# Patient Record
Sex: Female | Born: 1955
Health system: Southern US, Community
[De-identification: ages and names within clinical notes are randomized; demographics above are authoritative.]

## PROBLEM LIST (undated history)

## (undated) DIAGNOSIS — K219 Gastro-esophageal reflux disease without esophagitis: Secondary | ICD-10-CM

## (undated) DIAGNOSIS — C801 Malignant (primary) neoplasm, unspecified: Secondary | ICD-10-CM

## (undated) DIAGNOSIS — E7800 Pure hypercholesterolemia, unspecified: Secondary | ICD-10-CM

## (undated) HISTORY — PX: BREAST SURGERY: SHX581

## (undated) HISTORY — PX: MASTECTOMY: SHX3

---

## 2020-06-12 ENCOUNTER — Other Ambulatory Visit: Payer: Self-pay

## 2020-06-12 ENCOUNTER — Ambulatory Visit
Admission: RE | Admit: 2020-06-12 | Discharge: 2020-06-12 | Disposition: A | Discharge status: Home / Self Care | Payer: Medicare Other | Source: Ambulatory Visit | Attending: Emergency Medicine | Admitting: Emergency Medicine

## 2020-06-12 VITALS — BP 119/74 | HR 63 | Temp 98.1°F | Resp 19 | Ht 62.0 in | Wt 166.0 lb

## 2020-06-12 DIAGNOSIS — Z23 Encounter for immunization: Secondary | ICD-10-CM | POA: Diagnosis not present

## 2020-06-12 DIAGNOSIS — S6440XA Injury of digital nerve of unspecified finger, initial encounter: Secondary | ICD-10-CM | POA: Diagnosis not present

## 2020-06-12 HISTORY — DX: Pure hypercholesterolemia, unspecified: E78.00

## 2020-06-12 HISTORY — DX: Malignant (primary) neoplasm, unspecified: C80.1

## 2020-06-12 HISTORY — DX: Gastro-esophageal reflux disease without esophagitis: K21.9

## 2020-06-12 MED ORDER — TETANUS-DIPHTH-ACELL PERTUSSIS 5-2.5-18.5 LF-MCG/0.5 IM SUSY
0.5000 mL | PREFILLED_SYRINGE | Freq: Once | INTRAMUSCULAR | Status: AC
  Administered 2020-06-12: 0.5 mL via INTRAMUSCULAR

## 2020-06-12 MED ORDER — CEPHALEXIN 500 MG PO CAPS: 500.0000 mg | ORAL_CAPSULE | Freq: Four times a day (QID) | ORAL | 0 refills | Status: DC

## 2020-06-12 NOTE — ED Triage Notes (Signed)
Laceration to RT knuckle for a steak knife done today, no active bleeding. Unsure of last tetanus vaccine.

## 2020-06-12 NOTE — Discharge Instructions (Addendum)
Bandage applied Keep covered for next and dry for next 24-48 hours.  After then you may gently clean with warm water and mild soap.  Avoid submerging wound in water. Change dressing daily and apply a thin layer of neosporin.  Return in 7-10 days to have sutures removed.   Take OTC ibuprofen or tylenol as needed for pain releif Return sooner or go to the ED if you have any new or worsening symptoms such as increased pain, redness, swelling, drainage, discharge, decreased range of motion of extremity, etc..

## 2020-06-12 NOTE — ED Provider Notes (Signed)
Emma Roberts   [id number] 06/12/20 Arrival Time: 0240  CC: LACERATION  SUBJECTIVE:  Emma Roberts is a 64 y.o. female who presented to the urgent care for complaint of laceration to right right index finger that occurred today.  Patient reports she carries self with a knife.  Currently not on blood thinners.  Denies similar symptoms in the past.  Denies chills, fever, nausea, vomiting, redness, swelling, purulent drainage, decreased strength or sensation.  Td UTD: No.  ROS: As per HPI.  All other pertinent ROS negative.     Past Medical History:  Diagnosis Date  . Cancer [facility])    breast cancer in the past  . GERD (gastroesophageal reflux disease)   . High cholesterol    Past Surgical History:  Procedure Laterality Date  . BREAST SURGERY     No Known Allergies No current facility-administered medications on file prior to encounter.   No current outpatient medications on file prior to encounter.   Social History   Socioeconomic History  . Marital status: Single    Spouse name: Not on file  . Number of children: Not on file  . Years of education: Not on file  . Highest education level: Not on file  Occupational History  . Not on file  Tobacco Use  . Smoking status: Never Smoker  . Smokeless tobacco: Never Used  Substance and Sexual Activity  . Alcohol use: Never  . Drug use: Never  . Sexual activity: Not on file  Other Topics Concern  . Not on file  Social History Narrative  . Not on file   Social Determinants of Health   Financial Resource Strain:   . Difficulty of Paying Living Expenses: Not on file  Food Insecurity:   . Worried About Charity fundraiser in the Last Year: Not on file  . Ran Out of Food in the Last Year: Not on file  Transportation Needs:   . Lack of Transportation (Medical): Not on file  . Lack of Transportation (Non-Medical): Not on file  Physical Activity:   . Days of Exercise per Week: Not on file  . Minutes of Exercise per  Session: Not on file  Stress:   . Feeling of Stress : Not on file  Social Connections:   . Frequency of Communication with Friends and Family: Not on file  . Frequency of Social Gatherings with Friends and Family: Not on file  . Attends Religious Services: Not on file  . Active Member of Clubs or Organizations: Not on file  . Attends Archivist Meetings: Not on file  . Marital Status: Not on file  Intimate Partner Violence:   . Fear of Current or Ex-Partner: Not on file  . Emotionally Abused: Not on file  . Physically Abused: Not on file  . Sexually Abused: Not on file   History reviewed. No pertinent family history.   OBJECTIVE:  Vitals:   06/12/20 1514 06/12/20 1516  BP:  119/74  Pulse:  63  Resp:  19  Temp:  98.1 F (36.7 C)  TempSrc:  Oral  SpO2:  95%  Weight: 166 lb (75.3 kg)   Height: 5\' 2"  (1.575 m)      General appearance: alert; no distress Chest: CTA, heart sounds normal Heart: RRR, no rub, gallop or murmur Skin: laceration of right index finger size: approx 2.5 cm Psychological: alert and cooperative; normal mood and affect   No results found for this or any previous visit.  Labs  Reviewed - No data to display  No results found.  Procedure: Verbal consent obtained. Patient provided with risks and alternatives to the procedure. Wound copiously irrigated with NS then cleansed with betadine. Anesthetized with 2 mL of lidocaine without epinephrine after LET. Wound carefully explored. No foreign body, tendon injury, or nonviable tissue were noted. Using sterile technique 2 interrupted 5-0 Ethilon Prolene sutures were placed to reapproximate the wound. Patient tolerated procedure well. No complications. Minimal bleeding. Patient advised to look for and return for any signs of infection such as redness, swelling, discharge, or worsening pain. Return for suture removal in 7 days.  ASSESSMENT & PLAN:  1. Laceration of digital nerve of finger, initial  encounter     Meds ordered this encounter  Medications  . Tdap (BOOSTRIX) injection 0.5 mL  . cephALEXin (KEFLEX) 500 MG capsule    Sig: Take 1 capsule (500 mg total) by mouth 4 (four) times daily.    Dispense:  20 capsule    Refill:  0    Discharge instructions  Bandage applied Keep covered for next and dry for next 24-48 hours.  After then you may gently clean with warm water and mild soap.  Avoid submerging wound in water. Change dressing daily and apply a thin layer of neosporin.  Return in 7-10 days to have sutures removed.   Take OTC ibuprofen or tylenol as needed for pain releif Return sooner or go to the ED if you have any new or worsening symptoms such as increased pain, redness, swelling, drainage, discharge, decreased range of motion of extremity, etc..     Reviewed expectations re: course of current medical issues. Questions answered. Outlined signs and symptoms indicating need for more acute intervention. Patient verbalized understanding. After Visit Summary given.   Emma Roberts, [place] 06/12/20 1654

## 2020-07-29 ENCOUNTER — Other Ambulatory Visit: Payer: Self-pay

## 2020-07-29 ENCOUNTER — Encounter: Payer: Self-pay | Admitting: Emergency Medicine

## 2020-07-29 ENCOUNTER — Ambulatory Visit
Admission: EM | Admit: 2020-07-29 | Discharge: 2020-07-29 | Disposition: A | Discharge status: Home / Self Care | Payer: Medicare (Managed Care) | Attending: Emergency Medicine | Admitting: Emergency Medicine

## 2020-07-29 DIAGNOSIS — R300 Dysuria: Secondary | ICD-10-CM | POA: Insufficient documentation

## 2020-07-29 LAB — POCT URINALYSIS DIP (MANUAL ENTRY)
Bilirubin, UA: NEGATIVE
Blood, UA: NEGATIVE
Glucose, UA: NEGATIVE mg/dL
Leukocytes, UA: NEGATIVE
Nitrite, UA: NEGATIVE
Protein Ur, POC: 30 mg/dL — AB
Spec Grav, UA: >=1.03 — AB (ref 1.010–1.025)
Urobilinogen, UA: 0.2 E.U./dL
pH, UA: 6 (ref 5.0–8.0)

## 2020-07-29 NOTE — ED Triage Notes (Signed)
Burning and urinary frequency that started Sunday

## 2020-07-29 NOTE — Discharge Instructions (Addendum)
Urine culture sent.  We will call you with the results.   Push fluids and get plenty of rest.   Continue to use Azo and cranberry juice as directed Follow up with PCP if symptoms persists Return here or go to ER if you have any new or worsening symptoms such as fever, worsening abdominal pain, nausea/vomiting, flank pain, etc..Marland Kitchen

## 2020-07-29 NOTE — ED Provider Notes (Signed)
[facility]   Chief Complaint  Patient presents with  . Urinary Tract Infection     SUBJECTIVE:  Emma Roberts is a 65 y.o. female who presented to the urgent care for complaint of dysuria and urinary frequency that started this past Sunday.  Patient denies a precipitating event, recent sexual encounter, excessive caffeine intake.  Denies abdomen/flank pain.  Has tried Azo and cranberry juice with mild relief.  Symptoms are made worse with urination.  Admits to similar symptoms in the past.  Denies fever, chills, nausea, vomiting, abdominal pain, flank pain, abnormal vaginal discharge or bleeding, hematuria.    LMP: No LMP recorded. Patient is postmenopausal.  ROS: As in HPI.  All other pertinent ROS negative.     Past Medical History:  Diagnosis Date  . Cancer [facility])    breast cancer in the past  . GERD (gastroesophageal reflux disease)   . High cholesterol    Past Surgical History:  Procedure Laterality Date  . BREAST SURGERY     No Known Allergies No current facility-administered medications on file prior to encounter.   Current Outpatient Medications on File Prior to Encounter  Medication Sig Dispense Refill  . cephALEXin (KEFLEX) 500 MG capsule Take 1 capsule (500 mg total) by mouth 4 (four) times daily. 20 capsule 0   Social History   Socioeconomic History  . Marital status: Single    Spouse name: Not on file  . Number of children: Not on file  . Years of education: Not on file  . Highest education level: Not on file  Occupational History  . Not on file  Tobacco Use  . Smoking status: Never Smoker  . Smokeless tobacco: Never Used  Substance and Sexual Activity  . Alcohol use: Never  . Drug use: Never  . Sexual activity: Not on file  Other Topics Concern  . Not on file  Social History Narrative  . Not on file   Social Determinants of Health   Financial Resource Strain: Not on file  Food Insecurity: Not on file  Transportation Needs: Not on  file  Physical Activity: Not on file  Stress: Not on file  Social Connections: Not on file  Intimate Partner Violence: Not on file   No family history on file.  OBJECTIVE:  Vitals:   07/29/20 1154  BP: 127/83  Pulse: 77  Resp: 17  Temp: 98 F (36.7 C)  TempSrc: Oral  SpO2: 96%   General appearance: AOx3 in no acute distress HEENT: NCAT.  Oropharynx clear.  Lungs: clear to auscultation bilaterally without adventitious breath sounds Heart: regular rate and rhythm.  Radial pulses 2+ symmetrical bilaterally Abdomen: soft; non-distended; no tenderness; bowel sounds present; no guarding or rebound tenderness Back: no CVA tenderness Extremities: no edema; symmetrical with no gross deformities Skin: warm and dry Neurologic: Ambulates from chair to exam table without difficulty Psychological: alert and cooperative; normal mood and affect  Labs Reviewed  POCT URINALYSIS DIP (MANUAL ENTRY) - Abnormal; Notable for the following components:      Result Value   Color, UA orange (*)    Ketones, POC UA trace (5) (*)    Spec Grav, UA >=1.030 (*)    Protein Ur, POC =30 (*)    All other components within normal limits  URINE CULTURE    ASSESSMENT & PLAN:  1. Dysuria     No orders of the defined types were placed in this encounter.  Discharge Instructions  Urine culture sent.  We  will call you with the results.   Push fluids and get plenty of rest.   Continue to use Azo and cranberry juice as directed Follow up with PCP if symptoms persists Return here or go to ER if you have any new or worsening symptoms such as fever, worsening abdominal pain, nausea/vomiting, flank pain, etc...  Outlined signs and symptoms indicating need for more acute intervention. Patient verbalized understanding. After Visit Summary given.     Durward Parcel, FNP 07/29/20 1230

## 2020-07-31 LAB — URINE CULTURE: Culture: <10000 — AB

## 2020-09-27 ENCOUNTER — Ambulatory Visit: Payer: Medicare (Managed Care) | Admitting: Nurse Practitioner

## 2020-10-04 ENCOUNTER — Ambulatory Visit (INDEPENDENT_AMBULATORY_CARE_PROVIDER_SITE_OTHER): Payer: Medicare HMO | Admitting: Nurse Practitioner

## 2020-10-04 ENCOUNTER — Other Ambulatory Visit: Payer: Self-pay

## 2020-10-04 ENCOUNTER — Encounter: Payer: Self-pay | Admitting: Nurse Practitioner

## 2020-10-04 VITALS — BP 129/70 | HR 80 | Temp 99.2°F | Resp 20 | Ht 62.0 in | Wt 170.0 lb

## 2020-10-04 DIAGNOSIS — Z7689 Persons encountering health services in other specified circumstances: Secondary | ICD-10-CM | POA: Diagnosis not present

## 2020-10-04 DIAGNOSIS — R079 Chest pain, unspecified: Secondary | ICD-10-CM | POA: Diagnosis not present

## 2020-10-04 DIAGNOSIS — E785 Hyperlipidemia, unspecified: Secondary | ICD-10-CM

## 2020-10-04 DIAGNOSIS — Z0001 Encounter for general adult medical examination with abnormal findings: Secondary | ICD-10-CM | POA: Insufficient documentation

## 2020-10-04 DIAGNOSIS — K219 Gastro-esophageal reflux disease without esophagitis: Secondary | ICD-10-CM

## 2020-10-04 DIAGNOSIS — F339 Major depressive disorder, recurrent, unspecified: Secondary | ICD-10-CM

## 2020-10-04 DIAGNOSIS — R413 Other amnesia: Secondary | ICD-10-CM

## 2020-10-04 DIAGNOSIS — G4700 Insomnia, unspecified: Secondary | ICD-10-CM

## 2020-10-04 DIAGNOSIS — Z139 Encounter for screening, unspecified: Secondary | ICD-10-CM | POA: Diagnosis not present

## 2020-10-04 MED ORDER — FLUOXETINE HCL 20 MG PO TABS: 20.0000 mg | ORAL_TABLET | Freq: Every day | ORAL | 1 refills | Status: DC

## 2020-10-04 NOTE — Assessment & Plan Note (Addendum)
-  had PCP in [place], [place] -obtain records

## 2020-10-04 NOTE — Addendum Note (Signed)
Addended by: Demetrius Revel on: 10/04/2020 03:37 PM   Modules accepted: Orders

## 2020-10-04 NOTE — Patient Instructions (Addendum)
We will meet back up in 2 weeks to discuss labs. Please have fasting lab work drawn 2-3 days prior to you visit so we can review them at the time of your appointment.

## 2020-10-04 NOTE — Progress Notes (Addendum)
New Patient Office Visit  Subjective:  Patient ID: Emma Roberts, female    DOB: 1955/11/01  Age: 65 y.o. MRN: [id number]  CC:  Chief Complaint  Patient presents with  . New Patient (Initial Visit)    Memory issues     HPI Emma Roberts presents for new patient visit. Transferring care from [place], [place] and she went to [facility]. Last physical Sept 2021. Last labs were done then.  She has some short-term memory loss and some long-term memory loss. She states that she can't remember what she ate in a day, and she has got lost in car and couldn't find her way home.   She has been taking supplements to help her memory.  Past Medical History:  Diagnosis Date  . Cancer [facility])    breast cancer in the past  . GERD (gastroesophageal reflux disease)   . High cholesterol     Past Surgical History:  Procedure Laterality Date  . BREAST SURGERY      History reviewed. No pertinent family history.  Social History   Socioeconomic History  . Marital status: Single    Spouse name: Not on file  . Number of children: Not on file  . Years of education: Not on file  . Highest education level: Not on file  Occupational History  . Not on file  Tobacco Use  . Smoking status: Never Smoker  . Smokeless tobacco: Never Used  Vaping Use  . Vaping Use: Never used  Substance and Sexual Activity  . Alcohol use: Never  . Drug use: Never  . Sexual activity: Not on file  Other Topics Concern  . Not on file  Social History Narrative  . Not on file   Social Determinants of Health   Financial Resource Strain: Not on file  Food Insecurity: Not on file  Transportation Needs: Not on file  Physical Activity: Not on file  Stress: Not on file  Social Connections: Not on file  Intimate Partner Violence: Not on file    ROS Review of Systems  Constitutional: Negative.   Respiratory: Negative.   Cardiovascular: Positive for chest pain. Negative for palpitations and leg  swelling.       Sharp pain in her chest several weeks ago  Neurological: Negative for dizziness, tremors, seizures, syncope, facial asymmetry, speech difficulty, weakness and headaches.       Memory impairment  Psychiatric/Behavioral: Negative for self-injury and suicidal ideas.       Memory impairment for last few months    Objective:   Today's Vitals: BP 129/70   Pulse 80   Temp 99.2 F (37.3 C)   Resp 20   Ht 5' 2"  (1.575 m)   Wt 170 lb (77.1 kg)   SpO2 97%   BMI 31.09 kg/m   Physical Exam Constitutional:      Appearance: Normal appearance.  Cardiovascular:     Rate and Rhythm: Normal rate and regular rhythm.     Pulses: Normal pulses.     Heart sounds: Normal heart sounds.  Pulmonary:     Effort: Pulmonary effort is normal.     Breath sounds: Normal breath sounds.  Neurological:     General: No focal deficit present.     Mental Status: She is alert and oriented to person, place, and time.     Cranial Nerves: No cranial nerve deficit.     Sensory: No sensory deficit.     Motor: No weakness.  Coordination: Coordination normal.     Gait: Gait normal.     Comments: MMSE=25/30     Assessment & Plan:   Problem List Items Addressed This Visit      Digestive   GERD (gastroesophageal reflux disease)   Relevant Medications   esomeprazole (NEXIUM) 40 MG capsule     Other   Encounter to establish care    -had PCP in [place], [place] -obtain records      Relevant Orders   CBC with Differential/Platelet   CMP14+EGFR   HCV Ab w/Rflx to Verification   Lipid Panel With LDL/HDL Ratio   TSH + free T4   VITAMIN D 25 Hydroxy (Vit-D Deficiency, Fractures)   B12   Screening due   Relevant Orders   HCV Ab w/Rflx to Verification   Insomnia    -takes trazodone 50 mg PO qhs PRN  -no issues today      Hyperlipidemia    -will check lipids with next set of labs      Relevant Medications   atorvastatin (LIPITOR) 40 MG tablet   Other Relevant Orders   CBC with  Differential/Platelet   CMP14+EGFR   HCV Ab w/Rflx to Verification   Memory loss    -getting worse -moved in with daughter d/t memory issues -takes Lion's Mane supplement to help with memory -MMSE today is 25/30 -she would like referral to neurology for dementia eval -labs ordered today; will rule out Vit D deficiency      Relevant Orders   CBC with Differential/Platelet   CMP14+EGFR   Lipid Panel With LDL/HDL Ratio   TSH + free T4   VITAMIN D 25 Hydroxy (Vit-D Deficiency, Fractures)   B12   Ambulatory referral to Neurology   Depression, recurrent ([place])    -she is requesting that I stop lexapro and start fluoxetine -she states she started lexapro in September and she wants to see if that helps her memory issues STOP lexapro -Rx. Fluoxetine; may need to increase dose in the future       Relevant Medications   traZODone (DESYREL) 50 MG tablet   FLUoxetine (PROZAC) 20 MG tablet    Other Visit Diagnoses    Intermittent chest pain    -  Primary   Relevant Medications   traZODone (DESYREL) 50 MG tablet   FLUoxetine (PROZAC) 20 MG tablet   Other Relevant Orders   Ambulatory referral to Cardiology      Outpatient Encounter Medications as of 10/04/2020  Medication Sig  . atorvastatin (LIPITOR) 40 MG tablet Take 40 mg by mouth daily.  Marland Kitchen esomeprazole (NEXIUM) 40 MG capsule Take 40 mg by mouth daily at 12 noon.  Marland Kitchen FLUoxetine (PROZAC) 20 MG tablet Take 1 tablet (20 mg total) by mouth daily.  . traZODone (DESYREL) 50 MG tablet Take 50 mg by mouth at bedtime.  . [DISCONTINUED] escitalopram (LEXAPRO) 10 MG tablet Take 10 mg by mouth daily.  . [DISCONTINUED] cephALEXin (KEFLEX) 500 MG capsule Take 1 capsule (500 mg total) by mouth 4 (four) times daily. (Patient not taking: Reported on 10/04/2020)   No facility-administered encounter medications on file as of 10/04/2020.    Follow-up: Return in about 2 weeks (around 10/18/2020) for Lab follow-up and review records.   Emma Larsson,  NP

## 2020-10-04 NOTE — Assessment & Plan Note (Signed)
-  she is requesting that I stop lexapro and start fluoxetine -she states she started lexapro in September and she wants to see if that helps her memory issues STOP lexapro -Rx. Fluoxetine; may need to increase dose in the future

## 2020-10-04 NOTE — Addendum Note (Signed)
Addended by: Demetrius Revel on: 10/04/2020 03:46 PM   Modules accepted: Orders

## 2020-10-04 NOTE — Assessment & Plan Note (Signed)
-  takes trazodone 50 mg PO qhs PRN  -no issues today

## 2020-10-04 NOTE — Assessment & Plan Note (Signed)
-  will check lipids with next set of labs 

## 2020-10-04 NOTE — Assessment & Plan Note (Addendum)
-  getting worse -moved in with daughter d/t memory issues -takes Lion's Mane supplement to help with memory -MMSE today is 25/30 -she would like referral to neurology for dementia eval -labs ordered today; will rule out Vit D deficiency

## 2020-10-14 ENCOUNTER — Other Ambulatory Visit: Payer: Self-pay | Admitting: Nurse Practitioner

## 2020-10-14 MED ORDER — FLUOXETINE HCL 20 MG PO CAPS: 20.0000 mg | ORAL_CAPSULE | Freq: Every day | ORAL | 3 refills | Status: DC

## 2020-10-14 NOTE — Progress Notes (Signed)
Labs look great.

## 2020-10-15 LAB — CMP14+EGFR
ALT: 19 IU/L (ref 0–32)
AST: 26 IU/L (ref 0–40)
Albumin/Globulin Ratio: 1.8 (ref 1.2–2.2)
Albumin: 4.3 g/dL (ref 3.8–4.8)
Alkaline Phosphatase: 105 IU/L (ref 44–121)
BUN/Creatinine Ratio: 18 (ref 12–28)
BUN: 16 mg/dL (ref 8–27)
Bilirubin Total: 0.6 mg/dL (ref 0.0–1.2)
CO2: 23 mmol/L (ref 20–29)
Calcium: 9.8 mg/dL (ref 8.7–10.3)
Chloride: 100 mmol/L (ref 96–106)
Creatinine, Ser: 0.9 mg/dL (ref 0.57–1.00)
Globulin, Total: 2.4 g/dL (ref 1.5–4.5)
Glucose: 95 mg/dL (ref 65–99)
Potassium: 4.5 mmol/L (ref 3.5–5.2)
Sodium: 145 mmol/L — ABNORMAL HIGH (ref 134–144)
Total Protein: 6.7 g/dL (ref 6.0–8.5)
eGFR: 71 mL/min/{1.73_m2} (ref >59)

## 2020-10-15 LAB — LIPID PANEL WITH LDL/HDL RATIO
Cholesterol, Total: 183 mg/dL (ref 100–199)
HDL: 72 mg/dL (ref >39)
LDL Chol Calc (NIH): 95 mg/dL (ref 0–99)
LDL/HDL Ratio: 1.3 ratio (ref 0.0–3.2)
Triglycerides: 89 mg/dL (ref 0–149)
VLDL Cholesterol Cal: 16 mg/dL (ref 5–40)

## 2020-10-15 LAB — CBC WITH DIFFERENTIAL/PLATELET
Basophils Absolute: 0 10*3/uL (ref 0.0–0.2)
Basos: 1 %
EOS (ABSOLUTE): 0.1 10*3/uL (ref 0.0–0.4)
Eos: 2 %
Hematocrit: 41.2 % (ref 34.0–46.6)
Hemoglobin: 14 g/dL (ref 11.1–15.9)
Immature Grans (Abs): 0 10*3/uL (ref 0.0–0.1)
Immature Granulocytes: 0 %
Lymphocytes Absolute: 1.6 10*3/uL (ref 0.7–3.1)
Lymphs: 29 %
MCH: 31.3 pg (ref 26.6–33.0)
MCHC: 34 g/dL (ref 31.5–35.7)
MCV: 92 fL (ref 79–97)
Monocytes Absolute: 0.5 10*3/uL (ref 0.1–0.9)
Monocytes: 9 %
Neutrophils Absolute: 3.2 10*3/uL (ref 1.4–7.0)
Neutrophils: 59 %
Platelets: 237 10*3/uL (ref 150–450)
RBC: 4.47 x10E6/uL (ref 3.77–5.28)
RDW: 13.5 % (ref 11.7–15.4)
WBC: 5.4 10*3/uL (ref 3.4–10.8)

## 2020-10-15 LAB — VITAMIN D 25 HYDROXY (VIT D DEFICIENCY, FRACTURES): Vit D, 25-Hydroxy: 37.5 ng/mL (ref 30.0–100.0)

## 2020-10-15 LAB — HCV INTERPRETATION

## 2020-10-15 LAB — VITAMIN B12: Vitamin B-12: 577 pg/mL (ref 232–1245)

## 2020-10-15 LAB — TSH+FREE T4
Free T4: 1.08 ng/dL (ref 0.82–1.77)
TSH: 2.17 u[IU]/mL (ref 0.450–4.500)

## 2020-10-15 LAB — HCV AB W/RFLX TO VERIFICATION: HCV Ab: <0.1 s/co ratio (ref 0.0–0.9)

## 2020-10-20 ENCOUNTER — Ambulatory Visit (INDEPENDENT_AMBULATORY_CARE_PROVIDER_SITE_OTHER): Payer: Medicare HMO | Admitting: Nurse Practitioner

## 2020-10-20 ENCOUNTER — Encounter: Payer: Self-pay | Admitting: Nurse Practitioner

## 2020-10-20 ENCOUNTER — Other Ambulatory Visit: Payer: Self-pay

## 2020-10-20 DIAGNOSIS — F322 Major depressive disorder, single episode, severe without psychotic features: Secondary | ICD-10-CM | POA: Diagnosis not present

## 2020-10-20 DIAGNOSIS — E785 Hyperlipidemia, unspecified: Secondary | ICD-10-CM | POA: Diagnosis not present

## 2020-10-20 DIAGNOSIS — R413 Other amnesia: Secondary | ICD-10-CM | POA: Diagnosis not present

## 2020-10-20 NOTE — Assessment & Plan Note (Addendum)
-  Suicide attempt and was hospitalized at [facility] in October 2021 -PHQ-9 = 0 today, so this has improved drastically since moving to [place] from [facility] -reviewed medical records from [facility]

## 2020-10-20 NOTE — Patient Instructions (Signed)
Please have fasting labs drawn 2-3 days prior to your appointment so we can discuss the results during your office visit.  

## 2020-10-20 NOTE — Assessment & Plan Note (Signed)
Lab Results  Component Value Date   CHOL 183 10/13/2020   HDL 72 10/13/2020   LDLCALC 95 10/13/2020   TRIG 89 10/13/2020   -no need to add or change medicines today

## 2020-10-20 NOTE — Assessment & Plan Note (Signed)
-  her last MMSE was 25/30, and she has neurology appointment upcoming -has hospitalization for suicide attempt in Oct 2021 at [facility] and was diagnosed with severe depression, but she states that was related to a roommate issue in [facility] and that resolved when she came to [facility] -PHQ-9 was 0 today, and she does not have depressed mood -will defer meds to neurology

## 2020-10-20 NOTE — Progress Notes (Signed)
Established Patient Office Visit  Subjective:  Patient ID: Emma Roberts, female    DOB: [birth year]  Age: 65 y.o. MRN: [id number]  CC:  Chief Complaint  Patient presents with  . Follow-up    Go over labs; chest pains have resolved.     HPI Emma Roberts presents for lab follow-up. She had new patient visit on 10/04/20 and had complaints about memory loss. Her medical records show that she had a suicide attempt and was hospitalized at [facility] from 04/26/20-05/13/20. She was referred to psychiatry for severe depression, but it is unclear if she followed up, but she definitely doesn't see psychiatry here as she recently moved here.  She states that she has been forgeting items that she needs to purchase at the store. She has not forgotten where she is going when driving recently. She states that in [place] she bought a house with a friend who then wanted to back out of the living situation, and that had er stressed and depressed.  Today, PHQ-9 = 0, and she states that she has been much better with respect to depression since coming to [facility].  Past Medical History:  Diagnosis Date  . Cancer [facility])    breast cancer in the past  . GERD (gastroesophageal reflux disease)   . High cholesterol     Past Surgical History:  Procedure Laterality Date  . BREAST SURGERY      History reviewed. No pertinent family history.  Social History   Socioeconomic History  . Marital status: Single    Spouse name: Not on file  . Number of children: Not on file  . Years of education: Not on file  . Highest education level: Not on file  Occupational History  . Not on file  Tobacco Use  . Smoking status: Never Smoker  . Smokeless tobacco: Never Used  Vaping Use  . Vaping Use: Never used  Substance and Sexual Activity  . Alcohol use: Never  . Drug use: Never  . Sexual activity: Not on file  Other Topics Concern  . Not on file  Social History Narrative  . Not on file   Social Determinants of  Health   Financial Resource Strain: Not on file  Food Insecurity: Not on file  Transportation Needs: Not on file  Physical Activity: Not on file  Stress: Not on file  Social Connections: Not on file  Intimate Partner Violence: Not on file    Outpatient Medications Prior to Visit  Medication Sig Dispense Refill  . atorvastatin (LIPITOR) 40 MG tablet Take 40 mg by mouth daily.    Marland Kitchen esomeprazole (NEXIUM) 40 MG capsule Take 40 mg by mouth daily at 12 noon.    Marland Kitchen FLUoxetine (PROZAC) 20 MG capsule Take 1 capsule (20 mg total) by mouth daily. 30 capsule 3  . traZODone (DESYREL) 50 MG tablet Take 50 mg by mouth at bedtime.     No facility-administered medications prior to visit.    No Known Allergies  ROS Review of Systems  Constitutional: Negative.   Respiratory: Negative.   Cardiovascular: Negative.   Neurological:       Concerned with memory loss  Psychiatric/Behavioral: Negative for dysphoric mood, self-injury and suicidal ideas. The patient is not nervous/anxious.        States depression is much better after getting out of her roommate situation in [place]      Objective:    Physical Exam Constitutional:      Appearance: Normal appearance.  Cardiovascular:  Rate and Rhythm: Normal rate and regular rhythm.     Pulses: Normal pulses.     Heart sounds: Normal heart sounds.  Pulmonary:     Effort: Pulmonary effort is normal.     Breath sounds: Normal breath sounds.  Neurological:     General: No focal deficit present.     Mental Status: She is alert and oriented to person, place, and time.     Cranial Nerves: No cranial nerve deficit.     Motor: No weakness.     Coordination: Coordination normal.  Psychiatric:        Mood and Affect: Mood normal.        Behavior: Behavior normal.        Thought Content: Thought content normal.        Judgment: Judgment normal.     BP 112/62   Pulse 73   Temp 97.6 F (36.4 C)   Resp 18   Ht 5' 2"  (1.575 m)   Wt 168 lb (76.2  kg)   SpO2 96%   BMI 30.73 kg/m  Wt Readings from Last 3 Encounters:  10/20/20 168 lb (76.2 kg)  10/04/20 170 lb (77.1 kg)  06/12/20 166 lb (75.3 kg)     Health Maintenance Due  Topic Date Due  . PNA vac Low Risk Adult (1 of 2 - PCV13) Never done    There are no preventive care reminders to display for this patient.  Lab Results  Component Value Date   TSH 2.170 10/13/2020   Lab Results  Component Value Date   WBC 5.4 10/13/2020   HGB 14.0 10/13/2020   HCT 41.2 10/13/2020   MCV 92 10/13/2020   PLT 237 10/13/2020   Lab Results  Component Value Date   NA 145 (H) 10/13/2020   K 4.5 10/13/2020   CO2 23 10/13/2020   GLUCOSE 95 10/13/2020   BUN 16 10/13/2020   CREATININE 0.90 10/13/2020   BILITOT 0.6 10/13/2020   ALKPHOS 105 10/13/2020   AST 26 10/13/2020   ALT 19 10/13/2020   PROT 6.7 10/13/2020   ALBUMIN 4.3 10/13/2020   CALCIUM 9.8 10/13/2020   Lab Results  Component Value Date   CHOL 183 10/13/2020   Lab Results  Component Value Date   HDL 72 10/13/2020   Lab Results  Component Value Date   LDLCALC 95 10/13/2020   Lab Results  Component Value Date   TRIG 89 10/13/2020   No results found for: CHOLHDL No results found for: HGBA1C    Assessment & Plan:   Problem List Items Addressed This Visit      Other   Hyperlipidemia    Lab Results  Component Value Date   CHOL 183 10/13/2020   HDL 72 10/13/2020   LDLCALC 95 10/13/2020   TRIG 89 10/13/2020   -no need to add or change medicines today      Relevant Orders   CBC with Differential/Platelet   CMP14+EGFR   Lipid Panel With LDL/HDL Ratio   Memory loss    -her last MMSE was 25/30, and she has neurology appointment upcoming -has hospitalization for suicide attempt in Oct 2021 at [facility] and was diagnosed with severe depression, but she states that was related to a roommate issue in [facility] and that resolved when she came to [facility] -PHQ-9 was 0 today, and she does not have depressed  mood -will defer meds to neurology      Depression, major, single episode, severe ([place])    -  Suicide attempt and was hospitalized at [facility] in October 2021 -PHQ-9 = 0 today, so this has improved drastically since moving to [place] from [facility] -reviewed medical records from [facility]         No orders of the defined types were placed in this encounter.   Follow-up: Return in about 6 months (around 04/22/2021) for Physical Exam.    Noreene Larsson, NP

## 2020-11-14 ENCOUNTER — Encounter: Payer: Self-pay | Admitting: Nurse Practitioner

## 2020-11-15 ENCOUNTER — Other Ambulatory Visit: Payer: Self-pay

## 2020-11-15 MED ORDER — FLUOXETINE HCL 20 MG PO CAPS: 20.0000 mg | ORAL_CAPSULE | Freq: Every day | ORAL | 3 refills | Status: DC

## 2020-11-15 MED ORDER — ESOMEPRAZOLE MAGNESIUM 40 MG PO CPDR
40.0000 mg | DELAYED_RELEASE_CAPSULE | Freq: Every day | ORAL | 3 refills | Status: DC
Start: 2020-11-15 — End: 2021-01-20

## 2020-11-15 MED ORDER — ATORVASTATIN CALCIUM 40 MG PO TABS
40.0000 mg | ORAL_TABLET | Freq: Every day | ORAL | 3 refills | Status: DC
Start: 2020-11-15 — End: 2021-02-21

## 2020-12-09 ENCOUNTER — Ambulatory Visit: Payer: Medicare (Managed Care) | Admitting: Neurology

## 2020-12-09 ENCOUNTER — Encounter: Payer: Self-pay | Admitting: Neurology

## 2020-12-09 VITALS — BP 122/78 | HR 66 | Ht 62.0 in | Wt 166.0 lb

## 2020-12-09 DIAGNOSIS — R4189 Other symptoms and signs involving cognitive functions and awareness: Secondary | ICD-10-CM | POA: Diagnosis not present

## 2020-12-09 NOTE — Patient Instructions (Signed)
Recommendations to prevent or slow progression of cognitive decline:   Exercise You should increase exercise 30 to 45 minutes per day at least 3 days a week although 5 to 7 would be preferred. Any type of exercise (including walking) is acceptable although a recumbent bicycle may be best if you are unsteady. Disease related apathy can be a significant roadblock to exercise and the only way to overcome this is to make it a daily routine and perhaps have a reward at the end (something your loved one loves to eat or drink perhaps) or a personal trainer coming to the home can also be very useful. In general a structured, repetitive schedule is best.   Cardiovascular Health: You should optimize all cardiovascular risk factors (blood pressure, sugar, cholesterol) as vascular disease such as strokes and heart attacks can make memory problems much worse.   Diet: Eating a heart healthy (Mediterranean) diet is also a good idea; fish and poultry instead of red meat, nuts (mostly non-peanuts), vegetables, fruits, olive oil or canola oil (instead of butter), minimal salt (use other spices to flavor foods), whole grain rice, bread, cereal and pasta and wine in moderation.  General Health: Any diseases which effect your body will effect your brain such as a pneumonia, urinary infection, blood clot, heart attack or stroke. Keep contact with your primary care doctor for regular follow ups.  Sleep. A good nights sleep is healthy for the brain. Seven hours is recommended. If you have insomnia or poor sleep habits see the recommendations below  Tips: Structured and consistent daytime and nighttime routine, including regular wake times, bedtimes, and mealtimes, will be important for the patient to avoid confusion. Keeping frequently used items in designated places will help reduce stress from searching. If there are worries about getting lost do not let the patient leave home unaccompanied. They might benefit from wearing  an identification bracelet that will help others assist in finding home if they become lost. Information about nationwide safe return services and other helpful resources may be obtained through the Alzheimer's Association helpline at 1800-4137914692.  Finances, Power of Producer, television/film/video Directives: You should consider putting legal safeguards in place with regard to financial and medical decision making. While the spouse always has power of attorney for medical and financial issues in the absence of any form, you should consider what you want in case the spouse / caregiver is no longer around or capable of making decisions.   [place] : http://www.welch.com/.pdf  Or Google "[place]" AND "An Forensic scientist for [employer]  Other States: ApartmentMom.com.ee  The signature on these forms should be notarized.   DRIVING:   Driving only during the day Drive only to familiar Locations Avoid driving during bad weather  If you would like to be tested to see if you are driving safely, [facility] has a Clinical Driving Evaluation. To schedule an appointment call [phone].                RESOURCES:  Memory Loss: Improve your short term memory By Silvio Pate  The Alzheimer's Reading Room http://www.alzheimersreadingroom.com/   The Alzheimer's Compendium http://www.alzcompend.info/  [employer] www.dukefamilysupport.XNA 5176352757  Recommended resources for caregivers (All can be purchased on [employer]):  1) A Caregiver's Guide to Dementia: Using Activities and Other Strategies to Prevent, Reduce and Manage Behavioral Symptoms by Osie Bond. [place] and [employer]   2) A Caregiver's Guide to [employer] Dementia by Caleen Essex MS BSN and Jeneen Rinks  [place]worth   3) What If It's Not Alzheimer's?: A Caregiver's Guide  to Dementia by Koren Shiver (Author), Octaviano Batty (Editor)  3) The 36 hour day by Rabins and Mace  4) Understanding Difficult Behaviors by Merita Norton and White  Online course for helping caregivers reduce stress, guilt and frustration called the Caregivers Helpbook. The website is www.powerfultoolsforcaregivers.org  As a caregiver you are a Art gallery manager. Problems you face as a caregiver are usually unique to your situation and the way your loved-one's disease manifests itself. The best way to use these books is to look at the Table of Contents and read any chapters of interest or that apply to challenges you are having as a caregiver.  NATIONAL RESOURCES: For more information on neurological disorders or research programs funded by the [employer] of Neurological Disorders and Stroke, contact the Institute's Agricultural consultant (BRAIN) at: [place] [address] [phone] (toll-free) MasterBoxes.it  Information on dementia is also available from the following organizations: Alzheimer's Disease Education and Referral ([place]) [place] on Aging [address] [place] [address] [phone] (toll-free) DVDEnthusiasts.nl  Alzheimer's Association [address] [place] [address] [phone] (toll-free, 24-hour helpline) [phone] (TDD) CapitalMile.co.nz  Alzheimer's Foundation of America [address], [place] [address] [phone] (toll-free) www.alzfdn.org  Alzheimer's Drug [place] [address], [place] [address] [phone] www.alzdiscovery.org  Association for [place] [address], [place] [address] [phone] (toll-free) [place] [address] [phone]  (toll-free) www.brightfocus.org/alzheimers  Doran Stabler [facility] Alzheimer's Foundation [address], [place] [address] [phone] www.https://lambert-jackson.net/  Lewy Body Dementia Association [address], [place] [address] [phone] (toll-free LBD Caregiver Link) www.lbda.[place], [place], [place] [address] [phone] (toll-free) [phone] [facility]) https://carter.com/  National Organization for Rare Disorders [address] [place] [address] 4-174-081-KGYJ ([phone]) (toll-free) www.rarediseases.org  The Dementias: Hope Through Research was jointly produced by the [employer] of Neurological Disorders and Stroke (NINDS) and the [employer] on Aging (NIA), both part of the [employer], the [employer] research agency--supporting scientific studies that turn discovery into health. NINDS is the nation's leading funder of research on the brain and nervous system. The NINDS mission is to reduce the burden of neurological disease. For more information and resources, visit MasterBoxes.it [1] or call [phone]. NIA leads the federal government effort conducting and supporting research on aging and the health and well-being of older people. NIA's Alzheimer's Disease Education and Referral (ADEAR) Center offers information and publications on dementia and caregiving for families, caregivers, and professionals. For more information, visit DVDEnthusiasts.nl [2] or call [phone]. Also available from NIA are publications and information about Alzheimer's disease as well as the booklets Frontotemporal Disorders: Information for Patients, Families, and Caregivers and Lewy Body Dementia: Information for Patients, Families, and Professionals. Source URL:  SocialSpecialists.co.nz     Memory Compensation Strategies  1. Use "WARM" strategy.  W= write it down  A= associate it  R= repeat it  M= make a mental note  2.   You can keep a Social worker.  Use a 3-ring notebook with sections for the following: calendar, important names and phone numbers,  medications, doctors' names/phone numbers, lists/reminders, and a section to journal what you did  each day.  3.    Use a calendar to write appointments down.  4.    Write yourself a schedule for the day.  This can be placed on the calendar or in a separate section of the Memory Notebook.  Keeping a  regular schedule can help memory.  5.    Use medication organizer with sections for each day or morning/evening pills.  You may need help loading it  6.    Keep a basket, or pegboard by the door.  Place items that you need to take out with you in the basket or on the pegboard.  You may also want to  include a message board for reminders.  7.    Use sticky notes.  Place sticky notes with reminders in a place where the task is performed.  For example: " turn off the  stove" placed by the stove, "lock the door" placed on the door at eye level, " take your medications" on  the bathroom mirror or by the place where you normally take your medications.  8.    Use alarms/timers.  Use while cooking to remind yourself to check on food or as a reminder to take your medicine, or as a  reminder to make a call, or as a reminder to perform another task, etc.  Dementia Dementia is a condition that affects the way the brain functions. It often affects memory and thinking. Usually, dementia gets worse with time and cannot be reversed (progressive dementia). There are many types of dementia, including:  Alzheimer's disease. This type is the most common.  Vascular dementia. This type may happen as the result of a stroke.  Lewy body dementia. This type may happen to  people who have Parkinson's disease.  Frontotemporal dementia. This type is caused by damage to nerve cells (neurons) in certain parts of the brain. Some people may be affected by more than one type of dementia. This is called mixed dementia. What are the causes? Dementia is caused by damage to cells in the brain. The area of the brain and the types of cells damaged determine the type of dementia. Usually, this damage is irreversible or cannot be undone. Some examples of irreversible causes include:  Conditions that affect the blood vessels of the brain, such as diabetes, heart disease, or blood vessel disease.  Genetic mutations. In some cases, changes in the brain may be caused by another condition and can be reversed or slowed. Some examples of reversible causes include:  Injury to the brain.  Certain medicines.  Infection, such as meningitis.  Metabolic problems, such as vitamin B12 deficiency or thyroid disease.  Pressure on the brain, such as from a tumor, blood clot, or too much fluid in the brain (hydrocephalus).  Autoimmune diseases that affect the brain or arteries, such as limbic encephalitis or vasculitis. What are the signs or symptoms? Symptoms of dementia depend on the type of dementia. Common signs of dementia include problems with remembering, thinking, problem solving, decision making, and communicating. These signs develop slowly or get worse with time. This may include:  Problems remembering events or people.  Having trouble taking a bath or putting clothes on.  Forgetting appointments or forgetting to pay bills.  Difficulty planning and preparing meals.  Having trouble speaking.  Getting lost easily.  Changes in behavior or mood. How is this diagnosed? This condition is diagnosed by a specialist (neurologist). It is diagnosed based on the history of your symptoms, your medical history, a physical exam,  and tests. Tests may include:  Tests to evaluate  brain function, such as memory tests, cognitive tests, and other tests.  Lab tests, such as blood or urine tests.  Imaging tests, such as a CT scan, a PET scan, or an MRI.  Genetic testing. This may be done if other family members have a diagnosis of certain types of dementia. Your health care provider will talk with you and your family, friends, or caregivers about your history and symptoms.   How is this treated? Treatment for this condition depends on the cause of the dementia. Progressive dementias, such as Alzheimer's disease, cannot be cured, but there may be treatments that help to manage symptoms. Treatment might involve taking medicines that may help to:  Control the dementia.  Slow down the progression of the dementia.  Manage symptoms. In some cases, treating the cause of your dementia can improve symptoms, reverse symptoms, or slow down how quickly your dementia becomes worse. Your health care provider can direct you to support groups, organizations, and other health care providers who can help with decisions about your care. Follow these instructions at home: Medicines  Take over-the-counter and prescription medicines only as told by your health care provider.  Use a pill organizer or pill reminder to help you manage your medicines.  Avoid taking medicines that can affect thinking, such as pain medicines or sleeping medicines. Lifestyle  Make healthy lifestyle choices. ? Be physically active as told by your health care provider. ? Do not use any products that contain nicotine or tobacco, such as cigarettes, e-cigarettes, and chewing tobacco. If you need help quitting, ask your health care provider. ? Do not drink alcohol. ? Practice stress-management techniques when you get stressed. ? Spend time with other people.  Make sure to get quality sleep. These tips can help you get a good night's rest: ? Avoid napping during the day. ? Keep your sleeping area dark and  cool. ? Avoid exercising during the few hours before you go to bed. ? Avoid caffeine products in the evening. Eating and drinking  Drink enough fluid to keep your urine pale yellow.  Eat a healthy diet. General instructions  Work with your health care provider to determine what you need help with and what your safety needs are.  Talk with your health care provider about whether it is safe for you to drive.  If you were given a bracelet that identifies you as a person with memory loss or tracks your location, make sure to wear it at all times.  Work with your family to make important decisions, such as advance directives, medical power of attorney, or a living will.  Keep all follow-up visits. This is important.   Where to find more information  Alzheimer's Association: CapitalMile.co.nz  National Institute on Aging: DVDEnthusiasts.nl  World Health Organization: RoleLink.com.br Contact a health care provider if:  You have any new or worsening symptoms.  You have problems with choking or swallowing. Get help right away if:  You feel depressed or sad, or feel that you want to harm yourself.  Your family members become concerned for your safety. If you ever feel like you may hurt yourself or others, or have thoughts about taking your own life, get help right away. Go to your nearest emergency department or:  Call your local emergency services (911 in the [place]  Call a suicide crisis helpline, such as the [place] at (438)418-2680. This is open 24 hours a day  in the [place]  Text the Crisis Text Line at (681) 405-6891 (in the [place].). Summary  Dementia is a condition that affects the way the brain functions. Dementia often affects memory and thinking.  Usually, dementia gets worse with time and cannot be reversed (progressive dementia).  Treatment for this condition depends on the cause of the dementia.  Work with your health care provider to determine  what you need help with and what your safety needs are.  Your health care provider can direct you to support groups, organizations, and other health care providers who can help with decisions about your care. This information is not intended to replace advice given to you by your health care provider. Make sure you discuss any questions you have with your health care provider. Document Revised: 11/24/2019 Document Reviewed: 11/24/2019 Elsevier Patient Education  [place].

## 2020-12-09 NOTE — Progress Notes (Signed)
[facility]    Provider:  Dr Jaynee Eagles Requesting Provider: Noreene Larsson, NP Primary Care Provider:  Noreene Larsson, NP  CC:  Memory loss  HPI:  Emma Roberts is a 65 y.o. female here as requested by Noreene Larsson, NP for memory loss. PMHx breast cancer, high cholesterol, depression, insomnia.   Here with her daughter who provides information.  Memory loss started in the past couple years.  She notices it daily.  She has to write notes that she will forget.  She gets lost driving more than in the past.  She can states you could tell her something and 10 minutes later she forgets. Patient says she is a breast cancer survivor 3 years started noticing memory decline, she would get up to do something and forgot, losing things, puts things where they don't go and can't find them, get lunch and forgot what she had 2 hours ago, she recently moved here to live with her daughter last November from [employer] but grew up in [place]. Daughter says she started noticing it about 1.5 years ago, short term memory loss, she would forget things in a few hours, she would commit to doing things and then forget, more short-term memory loss but also some long term like forgetting where her son in law works and he has been there several years. She pays her own bills and doesn't miss it. She takes her own medications and doesn't miss anything. She will go somewhere and forget where she is going, she forgot how to get home. Mother died 33s without dementia, father died in early 28s, she has a family of 8 kids, she is in the middle, no one has been diagnosed with dementia. No drugs or significant alcohol use. She tries to walk. She tried to kill herself last year, she does feel alone, she has been alone all her life and she doesn't have as many friends, she worries about living alone, she is tired of being alone. She is tired but that is normal, no excessive daytime.    Reviewed notes, labs and imaging  from outside physicians, which showed:     I reviewed Sharlynn Oliphant notes: Patient had 2 visits in March of this year with complaints of memory loss, she noticed some short-term memory loss and also some long-term memory loss, stated she could not remember what she eaten today, she is got lost in the car and could not find her way home, she been taking supplements to help her memory, MMSE was 25 out of 30.  Patient feeling this is progressive, moving in with her daughter due to her memory issues, worried about dementia.  Her medical record showed that she had a suicide attempt and was hospitalized in October 2021, severe depression and unclear if it was treated appropriately, she has been forgetting items she needs to purchase at the store, she reported being stressed and depressed.  CBC, CMP, HCV, TSH, vitamin D and B12 were checked, I reviewed and they were unremarkable all essentially normal b12 was 577.   Review of Systems: Patient complains of symptoms per HPI as well as the following symptoms: memory loss. Pertinent negatives and positives per HPI. All others negative.   Social History   Socioeconomic History  . Marital status: Single    Spouse name: Not on file  . Number of children: Not on file  . Years of education: Not on file  . Highest education level: Some college, no degree  Occupational History  . Not on file  Tobacco Use  . Smoking status: Never Smoker  . Smokeless tobacco: Never Used  Vaping Use  . Vaping Use: Never used  Substance and Sexual Activity  . Alcohol use: Not Currently    Comment: very rarely   . Drug use: Never  . Sexual activity: Not on file  Other Topics Concern  . Not on file  Social History Narrative   Lives with her daughter   Left handed   Caffeine: 0   Social Determinants of Health   Financial Resource Strain: Not on file  Food Insecurity: Not on file  Transportation Needs: Not on file  Physical Activity: Not on file  Stress: Not on file   Social Connections: Not on file  Intimate Partner Violence: Not on file    Family History  Problem Relation Age of Onset  . Stroke Other   . Heart attack Other   . Alzheimer's disease Neg Hx   . Dementia Neg Hx     Past Medical History:  Diagnosis Date  . Cancer [facility])    breast cancer in the past  . GERD (gastroesophageal reflux disease)   . High cholesterol     Patient Active Problem List   Diagnosis Date Noted  . Subjective memory complaints 12/09/2020  . Depression, major, single episode, severe ([place]) 10/20/2020  . Encounter to establish care 10/04/2020  . Screening due 10/04/2020  . GERD (gastroesophageal reflux disease) 10/04/2020  . Insomnia 10/04/2020  . Hyperlipidemia 10/04/2020  . Memory loss 10/04/2020  . Depression, recurrent ([place]) 10/04/2020    Past Surgical History:  Procedure Laterality Date  . BREAST SURGERY     mastectomy and reconstruction using belly fat     Current Outpatient Medications  Medication Sig Dispense Refill  . atorvastatin (LIPITOR) 40 MG tablet Take 1 tablet (40 mg total) by mouth daily. 30 tablet 3  . esomeprazole (NEXIUM) 40 MG capsule Take 1 capsule (40 mg total) by mouth daily at 12 noon. 30 capsule 3  . FLUoxetine (PROZAC) 20 MG capsule Take 1 capsule (20 mg total) by mouth daily. 30 capsule 3  . Misc Natural Products (NEURIVA PO) Take 2 each by mouth.    . Multiple Vitamin (MULTIVITAMIN PO) Take 2 each by mouth.    . Multiple Vitamins-Minerals (ALGAE BASED CALCIUM PO) Take 2 each by mouth daily. Algae Calcium Plus    . Multiple Vitamins-Minerals (ALGAE BASED CALCIUM) TABS Take 2 each by mouth at bedtime. Algae Cal Strontium     No current facility-administered medications for this visit.    Allergies as of 12/09/2020 - Review Complete 12/09/2020  Allergen Reaction Noted  . Sulfamethoxazole-trimethoprim Anaphylaxis 02/10/2013    Vitals: BP 122/78 (BP Location: Left Arm, Patient Position: Sitting)   Pulse 66   Ht 5\' 2"   (1.575 m)   Wt 166 lb (75.3 kg)   BMI 30.36 kg/m  Last Weight:  Wt Readings from Last 1 Encounters:  12/09/20 166 lb (75.3 kg)   Last Height:   Ht Readings from Last 1 Encounters:  12/09/20 5\' 2"  (1.575 m)     Physical exam: Exam: Gen: NAD, conversant, well nourised, obese, well groomed                     CV: RRR, no MRG. No Carotid Bruits. No peripheral edema, warm, nontender Eyes: Conjunctivae clear without exudates or hemorrhage  Neuro: Detailed Neurologic Exam  Speech:    Speech is  normal; fluent and spontaneous with normal comprehension.  Cognition:  MMSE - Mini Mental State Exam 12/09/2020 10/04/2020  Orientation to time 4 4  Orientation to Place 4 3  Registration 3 3  Attention/ Calculation 3 5  Recall 0 1  Language- name 2 objects 2 2  Language- repeat 1 1  Language- follow 3 step command 3 3  Language- read & follow direction 1 1  Write a sentence 1 1  Copy design 1 1  Total score 23 25    Cranial Nerves:    The pupils are equal, round, and reactive to light. The fundi are flat. Visual fields are full to finger confrontation. Extraocular movements are intact. Trigeminal sensation is intact and the muscles of mastication are normal. The face is symmetric. The palate elevates in the midline. Hearing intact. Voice is normal. Shoulder shrug is normal. The tongue has normal motion without fasciculations.   Coordination:    Normal  Gait:    normal.   Motor Observation:    No asymmetry, no atrophy, and no involuntary movements noted. Tone:    Normal muscle tone.    Posture:    Posture is normal. normal erect    Strength:    Strength is V/V in the upper and lower limbs.      Sensation: intact to LT     Reflex Exam:  DTR's:    Deep tendon reflexes in the upper and lower extremities are normal bilaterally.   Toes:    The toes are downgoing bilaterally.   Clonus:    Clonus is absent.    Assessment/Plan:    Jaylea Plourde is a 65 y.o. female here as  requested by Noreene Larsson, NP for memory loss. PMHx breast cancer, high cholesterol, depression, insomnia. MMSE 23/30  - We discussed ways to maximize cognitive health and prevent dementia and Alzheimer's. Recommended managing depression, exercise, management of vascular risk factors, sleep hygiene and gave other suggestions. Gave her and daughter a book to read The XX Brain by [employer].   - Unfortunately there is an issue with her insurance and she declines any further work-up.  I did recommend MRI of the brain and formal memory testing.  I referred them to the [facility] healthy brain study and she may be able to get this work-up plus much more there.  If she changes her mind and wants me to order work-up I am happy to do so and she is welcome to come back and see me or MyChart me.  But patient and her daughter prefer to look into the [place].  No orders of the defined types were placed in this encounter.  No orders of the defined types were placed in this encounter.   Cc: Noreene Larsson, NP,  Noreene Larsson, NP  Sarina Ill, MD  [facility] [address] [place], [place] [address]  Phone [phone] Fax [phone]  I spent over 45 minutes of face-to-face and non-face-to-face time with patient on the  1. Subjective memory complaints    diagnosis.  This included previsit chart review, lab review, study review, order entry, electronic health record documentation, patient education on the different diagnostic and therapeutic options, counseling and coordination of care, risks and benefits of management, compliance, or risk factor reduction

## 2020-12-29 ENCOUNTER — Encounter: Payer: Self-pay | Admitting: Nurse Practitioner

## 2020-12-29 ENCOUNTER — Other Ambulatory Visit: Payer: Self-pay

## 2020-12-29 ENCOUNTER — Telehealth (INDEPENDENT_AMBULATORY_CARE_PROVIDER_SITE_OTHER): Payer: Medicare HMO | Admitting: Nurse Practitioner

## 2020-12-29 DIAGNOSIS — U071 COVID-19: Secondary | ICD-10-CM

## 2020-12-29 HISTORY — DX: COVID-19: U07.1

## 2020-12-29 MED ORDER — MOLNUPIRAVIR EUA 200MG CAPSULE: 4.0000 | ORAL_CAPSULE | Freq: Two times a day (BID) | ORAL | 0 refills | Status: AC

## 2020-12-29 MED ORDER — NOREL AD 4-10-325 MG PO TABS: 1.0000 | ORAL_TABLET | ORAL | 1 refills | Status: DC | PRN

## 2020-12-29 NOTE — Progress Notes (Signed)
Acute Office Visit  Subjective:    Patient ID: Emma Roberts, female    DOB: 03-15-1956, 65 y.o.   MRN: [id number]  Chief Complaint  Patient presents with  . Covid Positive    Pt tested positive on 12/28/20  . Nasal Congestion    X 2 days   . Fever    X 2 days     HPI Patient is in today for sick visit. She tested positive for COVID yesterday when her symptoms started.  She has taken nyquil, but she still has congestion.  Past Medical History:  Diagnosis Date  . Cancer [facility])    breast cancer in the past  . GERD (gastroesophageal reflux disease)   . High cholesterol     Past Surgical History:  Procedure Laterality Date  . BREAST SURGERY     mastectomy and reconstruction using belly fat     Family History  Problem Relation Age of Onset  . Stroke Other   . Heart attack Other   . Alzheimer's disease Neg Hx   . Dementia Neg Hx     Social History   Socioeconomic History  . Marital status: Single    Spouse name: Not on file  . Number of children: Not on file  . Years of education: Not on file  . Highest education level: Some college, no degree  Occupational History  . Not on file  Tobacco Use  . Smoking status: Never Smoker  . Smokeless tobacco: Never Used  Vaping Use  . Vaping Use: Never used  Substance and Sexual Activity  . Alcohol use: Not Currently    Comment: very rarely   . Drug use: Never  . Sexual activity: Not on file  Other Topics Concern  . Not on file  Social History Narrative   Lives with her daughter   Left handed   Caffeine: 0   Social Determinants of Health   Financial Resource Strain: Not on file  Food Insecurity: Not on file  Transportation Needs: Not on file  Physical Activity: Not on file  Stress: Not on file  Social Connections: Not on file  Intimate Partner Violence: Not on file    Outpatient Medications Prior to Visit  Medication Sig Dispense Refill  . atorvastatin (LIPITOR) 40 MG tablet Take 1 tablet (40 mg total) by  mouth daily. 30 tablet 3  . esomeprazole (NEXIUM) 40 MG capsule Take 1 capsule (40 mg total) by mouth daily at 12 noon. 30 capsule 3  . FLUoxetine (PROZAC) 20 MG capsule Take 1 capsule (20 mg total) by mouth daily. 30 capsule 3  . Misc Natural Products (NEURIVA PO) Take 2 each by mouth.    . Multiple Vitamin (MULTIVITAMIN PO) Take 2 each by mouth.    . Multiple Vitamins-Minerals (ALGAE BASED CALCIUM PO) Take 2 each by mouth daily. Algae Calcium Plus    . Multiple Vitamins-Minerals (ALGAE BASED CALCIUM) TABS Take 2 each by mouth at bedtime. Algae Cal Strontium     No facility-administered medications prior to visit.    Allergies  Allergen Reactions  . Sulfamethoxazole-Trimethoprim Anaphylaxis    Other reaction(s): Other (See Comments) Other Reaction: swelling mouth and tongue     Review of Systems  Constitutional: Positive for fatigue and fever. Negative for chills.  HENT: Positive for congestion and sneezing. Negative for sinus pressure, sinus pain and sore throat.   Respiratory: Negative for cough, shortness of breath and wheezing.   Cardiovascular: Negative.  Objective:    Physical Exam  There were no vitals taken for this visit. Wt Readings from Last 3 Encounters:  12/09/20 166 lb (75.3 kg)  10/20/20 168 lb (76.2 kg)  10/04/20 170 lb (77.1 kg)    Health Maintenance Due  Topic Date Due  . Zoster Vaccines- Shingrix (1 of 2) Never done  . PNA vac Low Risk Adult (1 of 2 - PCV13) Never done  . COVID-19 Vaccine (2 - Moderna 3-dose series) 07/27/2020    There are no preventive care reminders to display for this patient.   Lab Results  Component Value Date   TSH 2.170 10/13/2020   Lab Results  Component Value Date   WBC 5.4 10/13/2020   HGB 14.0 10/13/2020   HCT 41.2 10/13/2020   MCV 92 10/13/2020   PLT 237 10/13/2020   Lab Results  Component Value Date   NA 145 (H) 10/13/2020   K 4.5 10/13/2020   CO2 23 10/13/2020   GLUCOSE 95 10/13/2020   BUN 16  10/13/2020   CREATININE 0.90 10/13/2020   BILITOT 0.6 10/13/2020   ALKPHOS 105 10/13/2020   AST 26 10/13/2020   ALT 19 10/13/2020   PROT 6.7 10/13/2020   ALBUMIN 4.3 10/13/2020   CALCIUM 9.8 10/13/2020   EGFR 71 10/13/2020   Lab Results  Component Value Date   CHOL 183 10/13/2020   Lab Results  Component Value Date   HDL 72 10/13/2020   Lab Results  Component Value Date   LDLCALC 95 10/13/2020   Lab Results  Component Value Date   TRIG 89 10/13/2020   No results found for: CHOLHDL No results found for: HGBA1C     Assessment & Plan:   Problem List Items Addressed This Visit      Other   COVID-19    -she had positive home test yesterday -Rx. molnupiravir and norel -RTC if symptoms get worse      Relevant Medications   molnupiravir EUA 200 mg CAPS       Meds ordered this encounter  Medications  . Chlorphen-PE-Acetaminophen (NOREL AD) 4-10-325 MG TABS    Sig: Take 1 tablet by mouth every 4 (four) hours as needed (nasal congestion, cold symptoms).    Dispense:  20 tablet    Refill:  1  . molnupiravir EUA 200 mg CAPS    Sig: Take 4 capsules (800 mg total) by mouth 2 (two) times daily for 5 days.    Dispense:  40 capsule    Refill:  0   Date:  12/29/2020   Location of Patient: Home Location of Provider: Office Consent was obtain for visit to be over via telehealth. I verified that I am speaking with the correct person using two identifiers.  I connected with  Emma Roberts on 12/29/20 via telephone and verified that I am speaking with the correct person using two identifiers.   I discussed the limitations of evaluation and management by telemedicine. The patient expressed understanding and agreed to proceed.  Time spent: 7 minutes   Noreene Larsson, NP

## 2020-12-29 NOTE — Assessment & Plan Note (Signed)
-  she had positive home test yesterday -Rx. molnupiravir and norel -RTC if symptoms get worse

## 2021-01-04 DIAGNOSIS — H43812 Vitreous degeneration, left eye: Secondary | ICD-10-CM | POA: Diagnosis not present

## 2021-01-04 DIAGNOSIS — H524 Presbyopia: Secondary | ICD-10-CM | POA: Diagnosis not present

## 2021-01-04 DIAGNOSIS — H2513 Age-related nuclear cataract, bilateral: Secondary | ICD-10-CM | POA: Diagnosis not present

## 2021-01-20 ENCOUNTER — Other Ambulatory Visit: Payer: Self-pay | Admitting: *Deleted

## 2021-01-20 MED ORDER — ESOMEPRAZOLE MAGNESIUM 40 MG PO CPDR: 40.0000 mg | DELAYED_RELEASE_CAPSULE | Freq: Every day | ORAL | 3 refills | Status: DC

## 2021-02-21 ENCOUNTER — Other Ambulatory Visit: Payer: Self-pay

## 2021-02-21 MED ORDER — ATORVASTATIN CALCIUM 40 MG PO TABS: 40.0000 mg | ORAL_TABLET | Freq: Every day | ORAL | 3 refills | Status: DC

## 2021-02-25 ENCOUNTER — Encounter: Payer: Self-pay | Admitting: Nurse Practitioner

## 2021-02-25 ENCOUNTER — Other Ambulatory Visit: Payer: Self-pay

## 2021-02-25 ENCOUNTER — Ambulatory Visit (INDEPENDENT_AMBULATORY_CARE_PROVIDER_SITE_OTHER): Payer: Medicare Other | Admitting: Nurse Practitioner

## 2021-02-25 VITALS — BP 113/72 | HR 79 | Temp 97.0°F | Ht 62.0 in | Wt 167.0 lb

## 2021-02-25 DIAGNOSIS — H6590 Unspecified nonsuppurative otitis media, unspecified ear: Secondary | ICD-10-CM

## 2021-02-25 DIAGNOSIS — H6503 Acute serous otitis media, bilateral: Secondary | ICD-10-CM | POA: Diagnosis not present

## 2021-02-25 HISTORY — DX: Unspecified nonsuppurative otitis media, unspecified ear: H65.90

## 2021-02-25 MED ORDER — PREDNISONE 10 MG PO TABS: ORAL_TABLET | ORAL | 0 refills | Status: AC

## 2021-02-25 MED ORDER — FLUTICASONE PROPIONATE 50 MCG/ACT NA SUSP: 2.0000 | Freq: Every day | NASAL | 6 refills | Status: DC

## 2021-02-25 MED ORDER — IBUPROFEN 600 MG PO TABS: 600.0000 mg | ORAL_TABLET | Freq: Three times a day (TID) | ORAL | 0 refills | Status: DC | PRN

## 2021-02-25 NOTE — Assessment & Plan Note (Addendum)
Rx. flonase -Rx. Prednisone (short course) -ibuprofen -if no improvement by Monday she will notify us, will consider abx to prevent secondary infection, ? augmentin

## 2021-02-25 NOTE — Patient Instructions (Signed)
Call if there is no improvement on Monday and we will consider antibiotics.

## 2021-02-25 NOTE — Progress Notes (Signed)
Acute Office Visit  Subjective:    Patient ID: Emma Roberts, female    DOB: 09-Mar-1956, 65 y.o.   MRN: [id number]  Chief Complaint  Patient presents with   Otalgia    Both ears ongoing for 2-3 days now.     Otalgia  Pertinent negatives include no hearing loss or sore throat.  Patient is in today for bilateral ear pain x3 days. No OTC meds have been tried.  Past Medical History:  Diagnosis Date   Cancer ([place])    breast cancer in the past   GERD (gastroesophageal reflux disease)    High cholesterol     Past Surgical History:  Procedure Laterality Date   BREAST SURGERY     mastectomy and reconstruction using belly fat     Family History  Problem Relation Age of Onset   Stroke Other    Heart attack Other    Alzheimer's disease Neg Hx    Dementia Neg Hx     Social History   Socioeconomic History   Marital status: Single    Spouse name: Not on file   Number of children: Not on file   Years of education: Not on file   Highest education level: Some college, no degree  Occupational History   Not on file  Tobacco Use   Smoking status: Never   Smokeless tobacco: Never  Vaping Use   Vaping Use: Never used  Substance and Sexual Activity   Alcohol use: Not Currently    Comment: very rarely    Drug use: Never   Sexual activity: Not on file  Other Topics Concern   Not on file  Social History Narrative   Lives with her daughter   Left handed   Caffeine: 0   Social Determinants of Health   Financial Resource Strain: Not on file  Food Insecurity: Not on file  Transportation Needs: Not on file  Physical Activity: Not on file  Stress: Not on file  Social Connections: Not on file  Intimate Partner Violence: Not on file    Outpatient Medications Prior to Visit  Medication Sig Dispense Refill   atorvastatin (LIPITOR) 40 MG tablet Take 1 tablet (40 mg total) by mouth daily. 30 tablet 3   esomeprazole (NEXIUM) 40 MG capsule Take 1 capsule (40 mg total) by mouth  daily at 12 noon. 30 capsule 3   FLUoxetine (PROZAC) 20 MG capsule Take 1 capsule (20 mg total) by mouth daily. 30 capsule 3   Misc Natural Products (NEURIVA PO) Take 2 each by mouth.     Multiple Vitamin (MULTIVITAMIN PO) Take 2 each by mouth.     Multiple Vitamins-Minerals (ALGAE BASED CALCIUM PO) Take 2 each by mouth daily. Algae Calcium Plus     Multiple Vitamins-Minerals (ALGAE BASED CALCIUM) TABS Take 2 each by mouth at bedtime. Algae Cal Strontium     Chlorphen-PE-Acetaminophen (NOREL AD) 4-10-325 MG TABS Take 1 tablet by mouth every 4 (four) hours as needed (nasal congestion, cold symptoms). (Patient not taking: Reported on 02/25/2021) 20 tablet 1   No facility-administered medications prior to visit.    Allergies  Allergen Reactions   Sulfamethoxazole-Trimethoprim Anaphylaxis    Other reaction(s): Other (See Comments) Other Reaction: swelling mouth and tongue     Review of Systems  HENT:  Positive for ear pain. Negative for hearing loss, sinus pressure, sinus pain and sore throat.       Objective:    Physical Exam Constitutional:  Appearance: Normal appearance.  HENT:     Right Ear: Tympanic membrane, ear canal and external ear normal.     Left Ear: Tympanic membrane, ear canal and external ear normal.     Ears:     Comments: Serous fluid behind both TMs Neurological:     Mental Status: She is alert.    BP 113/72 (BP Location: Left Arm, Patient Position: Sitting, Cuff Size: Large)   Pulse 79   Temp (!) 97 F (36.1 C) (Temporal)   Ht _0  (1.575 m)   Wt 167 lb (75.8 kg)   SpO2 97%   BMI 30.54 kg/m  Wt Readings from Last 3 Encounters:  02/25/21 167 lb (75.8 kg)  12/09/20 166 lb (75.3 kg)  10/20/20 168 lb (76.2 kg)    Health Maintenance Due  Topic Date Due   Zoster Vaccines- Shingrix (1 of 2) Never done   PNA vac Low Risk Adult (1 of 2 - PCV13) Never done   INFLUENZA VACCINE  02/21/2021    There are no preventive care reminders to display for this  patient.   Lab Results  Component Value Date   TSH 2.170 10/13/2020   Lab Results  Component Value Date   WBC 5.4 10/13/2020   HGB 14.0 10/13/2020   HCT 41.2 10/13/2020   MCV 92 10/13/2020   PLT 237 10/13/2020   Lab Results  Component Value Date   NA 145 (H) 10/13/2020   K 4.5 10/13/2020   CO2 23 10/13/2020   GLUCOSE 95 10/13/2020   BUN 16 10/13/2020   CREATININE 0.90 10/13/2020   BILITOT 0.6 10/13/2020   ALKPHOS 105 10/13/2020   AST 26 10/13/2020   ALT 19 10/13/2020   PROT 6.7 10/13/2020   ALBUMIN 4.3 10/13/2020   CALCIUM 9.8 10/13/2020   EGFR 71 10/13/2020   Lab Results  Component Value Date   CHOL 183 10/13/2020   Lab Results  Component Value Date   HDL 72 10/13/2020   Lab Results  Component Value Date   LDLCALC 95 10/13/2020   Lab Results  Component Value Date   TRIG 89 10/13/2020   No results found for: CHOLHDL No results found for: HGBA1C     Assessment & Plan:   Problem List Items Addressed This Visit       Nervous and Auditory   Serous otitis media - Primary    Rx. flonase -Rx. Prednisone (short course) -ibuprofen -if no improvement in 4-5 days, will consider abx to prevent secondary infection       Relevant Medications   fluticasone (FLONASE) 50 MCG/ACT nasal spray   predniSONE (DELTASONE) 10 MG tablet   ibuprofen (ADVIL) 600 MG tablet     Meds ordered this encounter  Medications   fluticasone (FLONASE) 50 MCG/ACT nasal spray    Sig: Place 2 sprays into both nostrils daily.    Dispense:  16 g    Refill:  6   predniSONE (DELTASONE) 10 MG tablet    Sig: Take 4 tablets (40 mg total) by mouth daily with breakfast for 1 day, THEN 3 tablets (30 mg total) daily with breakfast for 1 day, THEN 2 tablets (20 mg total) daily with breakfast for 1 day, THEN 1 tablet (10 mg total) daily with breakfast for 1 day.    Dispense:  10 tablet    Refill:  0   ibuprofen (ADVIL) 600 MG tablet    Sig: Take 1 tablet (600 mg total) by mouth every 8  (  eight) hours as needed for headache, mild pain or moderate pain.    Dispense:  30 tablet    Refill:  0     Noreene Larsson, NP

## 2021-03-01 ENCOUNTER — Ambulatory Visit (INDEPENDENT_AMBULATORY_CARE_PROVIDER_SITE_OTHER): Payer: Medicare Other | Admitting: *Deleted

## 2021-03-01 ENCOUNTER — Other Ambulatory Visit: Payer: Self-pay

## 2021-03-01 DIAGNOSIS — Z0000 Encounter for general adult medical examination without abnormal findings: Secondary | ICD-10-CM | POA: Diagnosis not present

## 2021-03-01 NOTE — Progress Notes (Signed)
Subjective:   Emma Roberts is a 65 y.o. female who presents for Medicare Annual (Subsequent) preventive examination.   I connected with  Brittiney Waddles on 03/01/21 by an audio video enabled telemedicine application and verified that I am speaking with the correct person using two identifiers.   I discussed the limitations, risks, security and privacy concerns of performing an evaluation and management service by telephone and the availability of in person appointments. I also discussed with the patient that there may be a patient responsible charge related to this service. The patient expressed understanding and verbally consented to this telephonic visit.      Objective:    There were no vitals filed for this visit. There is no height or weight on file to calculate BMI.  No flowsheet data found.  Current Medications (verified) Outpatient Encounter Medications as of 03/01/2021  Medication Sig   atorvastatin (LIPITOR) 40 MG tablet Take 1 tablet (40 mg total) by mouth daily.   esomeprazole (NEXIUM) 40 MG capsule Take 1 capsule (40 mg total) by mouth daily at 12 noon.   FLUoxetine (PROZAC) 20 MG capsule Take 1 capsule (20 mg total) by mouth daily.   fluticasone (FLONASE) 50 MCG/ACT nasal spray Place 2 sprays into both nostrils daily.   ibuprofen (ADVIL) 600 MG tablet Take 1 tablet (600 mg total) by mouth every 8 (eight) hours as needed for headache, mild pain or moderate pain.   Misc Natural Products (NEURIVA PO) Take 2 each by mouth.   Multiple Vitamin (MULTIVITAMIN PO) Take 2 each by mouth.   Multiple Vitamins-Minerals (ALGAE BASED CALCIUM PO) Take 2 each by mouth daily. Algae Calcium Plus   Multiple Vitamins-Minerals (ALGAE BASED CALCIUM) TABS Take 2 each by mouth at bedtime. Algae Cal Strontium   predniSONE (DELTASONE) 10 MG tablet Take 4 tablets (40 mg total) by mouth daily with breakfast for 1 day, THEN 3 tablets (30 mg total) daily with breakfast for 1 day, THEN 2 tablets (20 mg  total) daily with breakfast for 1 day, THEN 1 tablet (10 mg total) daily with breakfast for 1 day.   No facility-administered encounter medications on file as of 03/01/2021.    Allergies (verified) Sulfamethoxazole-trimethoprim   History: Past Medical History:  Diagnosis Date   Cancer ([place])    breast cancer in the past   GERD (gastroesophageal reflux disease)    High cholesterol    Past Surgical History:  Procedure Laterality Date   BREAST SURGERY     mastectomy and reconstruction using belly fat    Family History  Problem Relation Age of Onset   Stroke Other    Heart attack Other    Alzheimer's disease Neg Hx    Dementia Neg Hx    Social History   Socioeconomic History   Marital status: Single    Spouse name: Not on file   Number of children: Not on file   Years of education: Not on file   Highest education level: Some college, no degree  Occupational History   Not on file  Tobacco Use   Smoking status: Never   Smokeless tobacco: Never  Vaping Use   Vaping Use: Never used  Substance and Sexual Activity   Alcohol use: Not Currently    Comment: very rarely    Drug use: Never   Sexual activity: Not on file  Other Topics Concern   Not on file  Social History Narrative   Lives with her daughter   Left handed  Caffeine: 0   Social Determinants of Radio broadcast assistant Strain: Not on file  Food Insecurity: Not on file  Transportation Needs: Not on file  Physical Activity: Not on file  Stress: Not on file  Social Connections: Not on file    Tobacco Counseling Counseling given: Not Answered   Clinical Intake:                 Diabetic? No          Activities of Daily Living No flowsheet data found.  Patient Care Team: Noreene Larsson, NP as PCP - General (Nurse Practitioner)  Indicate any recent Medical Services you may have received from other than [facility] providers in the past year (date may be approximate).     Assessment:    This is a routine wellness examination for [place].  Hearing/Vision screen No results found.  Dietary issues and exercise activities discussed:     Goals Addressed   None   Depression Screen PHQ 2/9 Scores 02/25/2021 12/29/2020 10/20/2020 10/04/2020  PHQ - 2 Score 1 0 0 2  PHQ- 9 Score - - 0 7    Fall Risk Fall Risk  02/25/2021 12/29/2020 12/09/2020 10/20/2020 10/04/2020  Falls in the past year? 0 0 1 0 1  Number falls in past yr: 0 0 1 0 0  Injury with Fall? 0 0 0 0 1  Risk for fall due to : No Fall Risks No Fall Risks Impaired balance/gait No Fall Risks No Fall Risks  Follow up Falls evaluation completed Falls evaluation completed - Falls evaluation completed Falls evaluation completed    [place]:  Any stairs in or around the home? No  If so, are there any without handrails? No  Home free of loose throw rugs in walkways, pet beds, electrical cords, etc? Yes  Adequate lighting in your home to reduce risk of falls? Yes   ASSISTIVE DEVICES UTILIZED TO PREVENT FALLS:  Life alert? No  Use of a cane, walker or w/c? No  Grab bars in the bathroom? No  Shower chair or bench in shower? No  Elevated toilet seat or a handicapped toilet? No   TIMED UP AND GO:  Was the test performed? No .    Cognitive Function: MMSE - Mini Mental State Exam 12/09/2020 10/04/2020  Orientation to time 4 4  Orientation to Place 4 3  Registration 3 3  Attention/ Calculation 3 5  Recall 0 1  Language- name 2 objects 2 2  Language- repeat 1 1  Language- follow 3 step command 3 3  Language- read & follow direction 1 1  Write a sentence 1 1  Copy design 1 1  Total score 23 25        Immunizations Immunization History  Administered Date(s) Administered   Moderna SARS-COV2 Booster Vaccination 06/29/2020   Tdap 06/12/2020    TDAP status: Up to date  Flu Vaccine status: Up to date  Pneumococcal vaccine status: Up to date  Covid-19 vaccine status: Completed  vaccines  Qualifies for Shingles Vaccine? Yes   Zostavax completed No   Shingrix Completed?: No.    Education has been provided regarding the importance of this vaccine. Patient has been advised to call insurance company to determine out of pocket expense if they have not yet received this vaccine. Advised may also receive vaccine at local pharmacy or Health Dept. Verbalized acceptance and understanding.  Screening Tests Health Maintenance  Topic Date  Due   Zoster Vaccines- Shingrix (1 of 2) Never done   PNA vac Low Risk Adult (1 of 2 - PCV13) Never done   INFLUENZA VACCINE  02/21/2021   COVID-19 Vaccine (2 - Moderna series) 03/13/2021 (Originally 07/27/2020)   MAMMOGRAM  03/04/2022   COLONOSCOPY (Pts 45-58yr Insurance coverage will need to be confirmed)  10/12/2029   TETANUS/TDAP  06/12/2030   DEXA SCAN  Completed   Hepatitis C Screening  Completed   HPV VACCINES  Aged Out    Health Maintenance  Health Maintenance Due  Topic Date Due   Zoster Vaccines- Shingrix (1 of 2) Never done   PNA vac Low Risk Adult (1 of 2 - PCV13) Never done   INFLUENZA VACCINE  02/21/2021    Colorectal cancer screening: Type of screening: Colonoscopy. Completed normal. Repeat every 10 years  Mammogram status: Completed normal. Repeat every year  Bone Density status: Completed normal. Results reflect: Bone density results: NORMAL. Repeat every 10 years.  Lung Cancer Screening: (Low Dose CT Chest recommended if Age 67-80years, 30 pack-year currently smoking OR have quit w/in 15years.) does not qualify.   Lung Cancer Screening Referral: NA  Additional Screening:  Hepatitis C Screening: does qualify; Completed.   Vision Screening: Recommended annual ophthalmology exams for early detection of glaucoma and other disorders of the eye. Is the patient up to date with their annual eye exam?  Yes  Who is the provider or what is the name of the office in which the patient attends annual eye exams? [facility]  Dr DAngelina SheriffIf pt is not established with a provider, would they like to be referred to a provider to establish care? No .   Dental Screening: Recommended annual dental exams for proper oral hygiene  Community Resource Referral / Chronic Care Management: CRR required this visit?  No   CCM required this visit?  No      Plan:     I have personally reviewed and noted the following in the patient's chart:   Medical and social history Use of alcohol, tobacco or illicit drugs  Current medications and supplements including opioid prescriptions.  Functional ability and status Nutritional status Physical activity Advanced directives List of other physicians Hospitalizations, surgeries, and ER visits in previous 12 months Vitals Screenings to include cognitive, depression, and falls Referrals and appointments  In addition, I have reviewed and discussed with patient certain preventive protocols, quality metrics, and best practice recommendations. A written personalized care plan for preventive services as well as general preventive health recommendations were provided to patient.     SZacarias PontesCMA 03-01-21  Nurse Notes: AWV conducted on the phone with pt consent to televisit via audio. Pt was present in the home at the time and provider on site.

## 2021-03-01 NOTE — Patient Instructions (Signed)
Emma Roberts , Thank you for taking time to come for your Medicare Wellness Visit. I appreciate your ongoing commitment to your health goals. Please review the following plan we discussed and let me know if I can assist you in the future.   Screening recommendations/referrals: Colonoscopy: Completed Due 10-12-29 Mammogram: Completed Due 03-04-22 Bone Density: Completed Due 04-12-2025 Recommended yearly ophthalmology/optometry visit for glaucoma screening and checkup Recommended yearly dental visit for hygiene and checkup  Vaccinations: Influenza vaccine: Due now  Pneumococcal vaccine: Due now  Tdap vaccine: Completed Due 06-12-30 Shingles vaccine: Completed    Advanced directives: Information Provided   Next appointment: 1 year    Preventive Care 14 Years and Older, Female Preventive care refers to lifestyle choices and visits with your health care provider that can promote health and wellness. What does preventive care include? A yearly physical exam. This is also called an annual well check. Dental exams once or twice a year. Routine eye exams. Ask your health care provider how often you should have your eyes checked. Personal lifestyle choices, including: Daily care of your teeth and gums. Regular physical activity. Eating a healthy diet. Avoiding tobacco and drug use. Limiting alcohol use. Practicing safe sex. Taking low-dose aspirin every day. Taking vitamin and mineral supplements as recommended by your health care provider. What happens during an annual well check? The services and screenings done by your health care provider during your annual well check will depend on your age, overall health, lifestyle risk factors, and family history of disease. Counseling  Your health care provider may ask you questions about your: Alcohol use. Tobacco use. Drug use. Emotional well-being. Home and relationship well-being. Sexual activity. Eating habits. History of falls. Memory  and ability to understand (cognition). Work and work Statistician. Reproductive health. Screening  You may have the following tests or measurements: Height, weight, and BMI. Blood pressure. Lipid and cholesterol levels. These may be checked every 5 years, or more frequently if you are over 39 years old. Skin check. Lung cancer screening. You may have this screening every year starting at age 75 if you have a 30-pack-year history of smoking and currently smoke or have quit within the past 15 years. Fecal occult blood test (FOBT) of the stool. You may have this test every year starting at age 80. Flexible sigmoidoscopy or colonoscopy. You may have a sigmoidoscopy every 5 years or a colonoscopy every 10 years starting at age 84. Hepatitis C blood test. Hepatitis B blood test. Sexually transmitted disease (STD) testing. Diabetes screening. This is done by checking your blood sugar (glucose) after you have not eaten for a while (fasting). You may have this done every 1-3 years. Bone density scan. This is done to screen for osteoporosis. You may have this done starting at age 63. Mammogram. This may be done every 1-2 years. Talk to your health care provider about how often you should have regular mammograms. Talk with your health care provider about your test results, treatment options, and if necessary, the need for more tests. Vaccines  Your health care provider may recommend certain vaccines, such as: Influenza vaccine. This is recommended every year. Tetanus, diphtheria, and acellular pertussis (Tdap, Td) vaccine. You may need a Td booster every 10 years. Zoster vaccine. You may need this after age 62. Pneumococcal 13-valent conjugate (PCV13) vaccine. One dose is recommended after age 75. Pneumococcal polysaccharide (PPSV23) vaccine. One dose is recommended after age 19. Talk to your health care provider about which screenings and vaccines  you need and how often you need them. This  information is not intended to replace advice given to you by your health care provider. Make sure you discuss any questions you have with your health care provider. Document Released: 08/06/2015 Document Revised: 03/29/2016 Document Reviewed: 05/11/2015 Elsevier Interactive Patient Education  2017 [place] Prevention in the Home Falls can cause injuries. They can happen to people of all ages. There are many things you can do to make your home safe and to help prevent falls. What can I do on the outside of my home? Regularly fix the edges of walkways and driveways and fix any cracks. Remove anything that might make you trip as you walk through a door, such as a raised step or threshold. Trim any bushes or trees on the path to your home. Use bright outdoor lighting. Clear any walking paths of anything that might make someone trip, such as rocks or tools. Regularly check to see if handrails are loose or broken. Make sure that both sides of any steps have handrails. Any raised decks and porches should have guardrails on the edges. Have any leaves, snow, or ice cleared regularly. Use sand or salt on walking paths during winter. Clean up any spills in your garage right away. This includes oil or grease spills. What can I do in the bathroom? Use night lights. Install grab bars by the toilet and in the tub and shower. Do not use towel bars as grab bars. Use non-skid mats or decals in the tub or shower. If you need to sit down in the shower, use a plastic, non-slip stool. Keep the floor dry. Clean up any water that spills on the floor as soon as it happens. Remove soap buildup in the tub or shower regularly. Attach bath mats securely with double-sided non-slip rug tape. Do not have throw rugs and other things on the floor that can make you trip. What can I do in the bedroom? Use night lights. Make sure that you have a light by your bed that is easy to reach. Do not use any sheets or  blankets that are too big for your bed. They should not hang down onto the floor. Have a firm chair that has side arms. You can use this for support while you get dressed. Do not have throw rugs and other things on the floor that can make you trip. What can I do in the kitchen? Clean up any spills right away. Avoid walking on wet floors. Keep items that you use a lot in easy-to-reach places. If you need to reach something above you, use a strong step stool that has a grab bar. Keep electrical cords out of the way. Do not use floor polish or wax that makes floors slippery. If you must use wax, use non-skid floor wax. Do not have throw rugs and other things on the floor that can make you trip. What can I do with my stairs? Do not leave any items on the stairs. Make sure that there are handrails on both sides of the stairs and use them. Fix handrails that are broken or loose. Make sure that handrails are as long as the stairways. Check any carpeting to make sure that it is firmly attached to the stairs. Fix any carpet that is loose or worn. Avoid having throw rugs at the top or bottom of the stairs. If you do have throw rugs, attach them to the floor with carpet tape. Make sure  that you have a light switch at the top of the stairs and the bottom of the stairs. If you do not have them, ask someone to add them for you. What else can I do to help prevent falls? Wear shoes that: Do not have high heels. Have rubber bottoms. Are comfortable and fit you well. Are closed at the toe. Do not wear sandals. If you use a stepladder: Make sure that it is fully opened. Do not climb a closed stepladder. Make sure that both sides of the stepladder are locked into place. Ask someone to hold it for you, if possible. Clearly mark and make sure that you can see: Any grab bars or handrails. First and last steps. Where the edge of each step is. Use tools that help you move around (mobility aids) if they are  needed. These include: Canes. Walkers. Scooters. Crutches. Turn on the lights when you go into a dark area. Replace any light bulbs as soon as they burn out. Set up your furniture so you have a clear path. Avoid moving your furniture around. If any of your floors are uneven, fix them. If there are any pets around you, be aware of where they are. Review your medicines with your doctor. Some medicines can make you feel dizzy. This can increase your chance of falling. Ask your doctor what other things that you can do to help prevent falls. This information is not intended to replace advice given to you by your health care provider. Make sure you discuss any questions you have with your health care provider. Document Released: 05/06/2009 Document Revised: 12/16/2015 Document Reviewed: 08/14/2014 Elsevier Interactive Patient Education  2017 [employer].

## 2021-04-21 ENCOUNTER — Encounter: Payer: Self-pay | Admitting: Nurse Practitioner

## 2021-04-21 ENCOUNTER — Other Ambulatory Visit: Payer: Self-pay

## 2021-04-21 ENCOUNTER — Ambulatory Visit (INDEPENDENT_AMBULATORY_CARE_PROVIDER_SITE_OTHER): Payer: Medicare Other | Admitting: Nurse Practitioner

## 2021-04-21 VITALS — BP 129/77 | HR 82 | Temp 98.4°F | Ht 62.0 in | Wt 169.0 lb

## 2021-04-21 DIAGNOSIS — Z0001 Encounter for general adult medical examination with abnormal findings: Secondary | ICD-10-CM | POA: Diagnosis not present

## 2021-04-21 DIAGNOSIS — Z23 Encounter for immunization: Secondary | ICD-10-CM | POA: Diagnosis not present

## 2021-04-21 DIAGNOSIS — E785 Hyperlipidemia, unspecified: Secondary | ICD-10-CM

## 2021-04-21 DIAGNOSIS — R200 Anesthesia of skin: Secondary | ICD-10-CM | POA: Diagnosis not present

## 2021-04-21 MED ORDER — ROSUVASTATIN CALCIUM 20 MG PO TABS: 20.0000 mg | ORAL_TABLET | Freq: Every day | ORAL | 3 refills | Status: DC

## 2021-04-21 NOTE — Assessment & Plan Note (Addendum)
-  CN exam negative -numbness to whole tongue doesn't fit innervation scheme r/t cranial nerve issues -will check electrolytes including B12 with routine labs today -if labs are negative, she will stop her supplements -Rx. Rosuvastatin; STOP atorvastatin

## 2021-04-21 NOTE — Assessment & Plan Note (Signed)
-  PNA and flu shot today -exam unremarkable

## 2021-04-21 NOTE — Patient Instructions (Signed)
Please have labs drawn today

## 2021-04-21 NOTE — Progress Notes (Signed)
Acute Office Visit  Subjective:    Patient ID: Charlayne Vultaggio, female    DOB: 02/01/1956, 65 y.o.   MRN: [id number]  Chief Complaint  Patient presents with   Annual Exam    Possible reaction to medications tongue feels like its going numb     HPI Patient is in today for physical exam. No recent labs.  She states she has had numbness to her tongue for the last month or 2. She states she notices this after she takes her medicine in the AM, but it occurs off and on all day. She states she doesn't notice this when she wakes up in the AM, but it occurs after taking her medicine.  Pt requesting flu and PNA shot today. No record of previous PNA shot.   Past Medical History:  Diagnosis Date   Cancer ([place])    breast cancer in the past   COVID-19 12/29/2020   GERD (gastroesophageal reflux disease)    High cholesterol     Past Surgical History:  Procedure Laterality Date   BREAST SURGERY     mastectomy and reconstruction using belly fat     Family History  Problem Relation Age of Onset   Stroke Other    Heart attack Other    Alzheimer's disease Neg Hx    Dementia Neg Hx     Social History   Socioeconomic History   Marital status: Single    Spouse name: Not on file   Number of children: Not on file   Years of education: Not on file   Highest education level: Some college, no degree  Occupational History   Not on file  Tobacco Use   Smoking status: Never   Smokeless tobacco: Never  Vaping Use   Vaping Use: Never used  Substance and Sexual Activity   Alcohol use: Not Currently    Comment: very rarely    Drug use: Never   Sexual activity: Not on file  Other Topics Concern   Not on file  Social History Narrative   Lives with her daughter   Left handed   Caffeine: 0   Social Determinants of Health   Financial Resource Strain: Low Risk    Difficulty of Paying Living Expenses: Not hard at all  Food Insecurity: No Food Insecurity   Worried About Sales executive in the Last Year: Never true   Ran Out of Food in the Last Year: Never true  Transportation Needs: No Transportation Needs   Lack of Transportation (Medical): No   Lack of Transportation (Non-Medical): No  Physical Activity: Sufficiently Active   Days of Exercise per Week: 5 days   Minutes of Exercise per Session: 50 min  Stress: No Stress Concern Present   Feeling of Stress : Not at all  Social Connections: Socially Isolated   Frequency of Communication with Friends and Family: More than three times a week   Frequency of Social Gatherings with Friends and Family: More than three times a week   Attends Religious Services: Never   Marine scientist or Organizations: No   Attends Music therapist: Never   Marital Status: Divorced  Human resources officer Violence: Not At Risk   Fear of Current or Ex-Partner: No   Emotionally Abused: No   Physically Abused: No   Sexually Abused: No    Outpatient Medications Prior to Visit  Medication Sig Dispense Refill   esomeprazole (NEXIUM) 40 MG capsule Take 1 capsule (40 mg  total) by mouth daily at 12 noon. 30 capsule 3   FLUoxetine (PROZAC) 20 MG capsule Take 1 capsule (20 mg total) by mouth daily. 30 capsule 3   fluticasone (FLONASE) 50 MCG/ACT nasal spray Place 2 sprays into both nostrils daily. 16 g 6   ibuprofen (ADVIL) 600 MG tablet Take 1 tablet (600 mg total) by mouth every 8 (eight) hours as needed for headache, mild pain or moderate pain. 30 tablet 0   Misc Natural Products (NEURIVA PO) Take 2 each by mouth.     Multiple Vitamin (MULTIVITAMIN PO) Take 2 each by mouth.     Multiple Vitamins-Minerals (ALGAE BASED CALCIUM PO) Take 2 each by mouth daily. Algae Calcium Plus     Multiple Vitamins-Minerals (ALGAE BASED CALCIUM) TABS Take 2 each by mouth at bedtime. Algae Cal Strontium     atorvastatin (LIPITOR) 40 MG tablet Take 1 tablet (40 mg total) by mouth daily. 30 tablet 3   No facility-administered medications prior to  visit.    Allergies  Allergen Reactions   Sulfamethoxazole-Trimethoprim Anaphylaxis    Other reaction(s): Other (See Comments) Other Reaction: swelling mouth and tongue     Review of Systems  Constitutional: Negative.   HENT: Negative.    Eyes: Negative.   Respiratory: Negative.    Cardiovascular: Negative.   Gastrointestinal: Negative.   Endocrine: Negative.   Genitourinary: Negative.   Musculoskeletal: Negative.   Skin: Negative.   Allergic/Immunologic: Negative.   Neurological:        Tongue numbness to whole tongue (not just anterior or posterior)  Hematological: Negative.   Psychiatric/Behavioral: Negative.        Objective:    Physical Exam Constitutional:      Appearance: Normal appearance.  HENT:     Head: Normocephalic and atraumatic.     Right Ear: Tympanic membrane, ear canal and external ear normal.     Left Ear: Tympanic membrane, ear canal and external ear normal.     Nose: Nose normal.     Mouth/Throat:     Mouth: Mucous membranes are moist.     Pharynx: Oropharynx is clear.     Comments: -tongue midline Eyes:     Extraocular Movements: Extraocular movements intact.     Conjunctiva/sclera: Conjunctivae normal.     Pupils: Pupils are equal, round, and reactive to light.  Cardiovascular:     Rate and Rhythm: Normal rate and regular rhythm.     Pulses: Normal pulses.     Heart sounds: Normal heart sounds.  Pulmonary:     Effort: Pulmonary effort is normal.     Breath sounds: Normal breath sounds.  Abdominal:     General: Abdomen is flat. Bowel sounds are normal.     Palpations: Abdomen is soft.  Musculoskeletal:        General: Normal range of motion.     Cervical back: Normal range of motion and neck supple.  Skin:    General: Skin is warm and dry.     Capillary Refill: Capillary refill takes less than 2 seconds.  Neurological:     General: No focal deficit present.     Mental Status: She is alert and oriented to person, place, and time.      Cranial Nerves: No cranial nerve deficit.     Sensory: No sensory deficit.     Motor: No weakness.     Coordination: Coordination normal.     Gait: Gait normal.  Psychiatric:  Mood and Affect: Mood normal.        Behavior: Behavior normal.        Thought Content: Thought content normal.        Judgment: Judgment normal.    BP 129/77 (BP Location: Right Arm, Patient Position: Sitting, Cuff Size: Large)   Pulse 82   Temp 98.4 F (36.9 C) (Oral)   Ht 5' 2"  (1.575 m)   Wt 169 lb 0.6 oz (76.7 kg)   SpO2 93%   BMI 30.92 kg/m  Wt Readings from Last 3 Encounters:  04/21/21 169 lb 0.6 oz (76.7 kg)  02/25/21 167 lb (75.8 kg)  12/09/20 166 lb (75.3 kg)    Health Maintenance Due  Topic Date Due   Zoster Vaccines- Shingrix (1 of 2) Never done    There are no preventive care reminders to display for this patient.   Lab Results  Component Value Date   TSH 2.170 10/13/2020   Lab Results  Component Value Date   WBC 5.4 10/13/2020   HGB 14.0 10/13/2020   HCT 41.2 10/13/2020   MCV 92 10/13/2020   PLT 237 10/13/2020   Lab Results  Component Value Date   NA 145 (H) 10/13/2020   K 4.5 10/13/2020   CO2 23 10/13/2020   GLUCOSE 95 10/13/2020   BUN 16 10/13/2020   CREATININE 0.90 10/13/2020   BILITOT 0.6 10/13/2020   ALKPHOS 105 10/13/2020   AST 26 10/13/2020   ALT 19 10/13/2020   PROT 6.7 10/13/2020   ALBUMIN 4.3 10/13/2020   CALCIUM 9.8 10/13/2020   EGFR 71 10/13/2020   Lab Results  Component Value Date   CHOL 183 10/13/2020   Lab Results  Component Value Date   HDL 72 10/13/2020   Lab Results  Component Value Date   LDLCALC 95 10/13/2020   Lab Results  Component Value Date   TRIG 89 10/13/2020   No results found for: CHOLHDL No results found for: HGBA1C     Assessment & Plan:   Problem List Items Addressed This Visit       Other   Encounter for general adult medical examination with abnormal findings - Primary    -PNA and flu shot  today -exam unremarkable      Relevant Orders   CBC with Differential/Platelet   CMP14+EGFR   Lipid Panel With LDL/HDL Ratio   B12   Hyperlipidemia    -check lipid panel today -STOP atorvastatin; Rx. rosuvastatin      Relevant Medications   rosuvastatin (CRESTOR) 20 MG tablet   Other Relevant Orders   CBC with Differential/Platelet   CMP14+EGFR   Lipid Panel With LDL/HDL Ratio   Numbness of tongue    -CN exam negative -numbness to whole tongue doesn't fit innervation scheme r/t cranial nerve issues -will check electrolytes including B12 with routine labs today -if labs are negative, she will stop her supplements -Rx. Rosuvastatin; STOP atorvastatin       Relevant Orders   B12   Other Visit Diagnoses     Need for immunization against influenza       Relevant Orders   Flu Vaccine QUAD High Dose(Fluad) (Completed)        Meds ordered this encounter  Medications   rosuvastatin (CRESTOR) 20 MG tablet    Sig: Take 1 tablet (20 mg total) by mouth daily.    Dispense:  90 tablet    Refill:  [place], NP

## 2021-04-21 NOTE — Assessment & Plan Note (Addendum)
-  check lipid panel today -STOP atorvastatin; Rx. rosuvastatin

## 2021-04-22 LAB — CMP14+EGFR
ALT: 19 IU/L (ref 0–32)
AST: 25 IU/L (ref 0–40)
Albumin/Globulin Ratio: 2 (ref 1.2–2.2)
Albumin: 4.4 g/dL (ref 3.8–4.8)
Alkaline Phosphatase: 138 IU/L — ABNORMAL HIGH (ref 44–121)
BUN/Creatinine Ratio: 15 (ref 12–28)
BUN: 14 mg/dL (ref 8–27)
Bilirubin Total: 0.4 mg/dL (ref 0.0–1.2)
CO2: 25 mmol/L (ref 20–29)
Calcium: 9.8 mg/dL (ref 8.7–10.3)
Chloride: 105 mmol/L (ref 96–106)
Creatinine, Ser: 0.92 mg/dL (ref 0.57–1.00)
Globulin, Total: 2.2 g/dL (ref 1.5–4.5)
Glucose: 95 mg/dL (ref 70–99)
Potassium: 4.7 mmol/L (ref 3.5–5.2)
Sodium: 143 mmol/L (ref 134–144)
Total Protein: 6.6 g/dL (ref 6.0–8.5)
eGFR: 68 mL/min/{1.73_m2} (ref >59)

## 2021-04-22 LAB — CBC WITH DIFFERENTIAL/PLATELET
Basophils Absolute: 0 10*3/uL (ref 0.0–0.2)
Basos: 1 %
EOS (ABSOLUTE): 0.1 10*3/uL (ref 0.0–0.4)
Eos: 1 %
Hematocrit: 42.9 % (ref 34.0–46.6)
Hemoglobin: 14.4 g/dL (ref 11.1–15.9)
Immature Grans (Abs): 0 10*3/uL (ref 0.0–0.1)
Immature Granulocytes: 1 %
Lymphocytes Absolute: 1.6 10*3/uL (ref 0.7–3.1)
Lymphs: 25 %
MCH: 31 pg (ref 26.6–33.0)
MCHC: 33.6 g/dL (ref 31.5–35.7)
MCV: 93 fL (ref 79–97)
Monocytes Absolute: 0.6 10*3/uL (ref 0.1–0.9)
Monocytes: 10 %
Neutrophils Absolute: 4.1 10*3/uL (ref 1.4–7.0)
Neutrophils: 62 %
Platelets: 274 10*3/uL (ref 150–450)
RBC: 4.64 x10E6/uL (ref 3.77–5.28)
RDW: 12.7 % (ref 11.7–15.4)
WBC: 6.5 10*3/uL (ref 3.4–10.8)

## 2021-04-22 LAB — LIPID PANEL WITH LDL/HDL RATIO
Cholesterol, Total: 181 mg/dL (ref 100–199)
HDL: 70 mg/dL (ref >39)
LDL Chol Calc (NIH): 93 mg/dL (ref 0–99)
LDL/HDL Ratio: 1.3 ratio (ref 0.0–3.2)
Triglycerides: 98 mg/dL (ref 0–149)
VLDL Cholesterol Cal: 18 mg/dL (ref 5–40)

## 2021-04-22 LAB — VITAMIN B12: Vitamin B-12: 567 pg/mL (ref 232–1245)

## 2021-04-22 NOTE — Progress Notes (Signed)
Labs look OK. Alk phos is slightly elevated, but she wasn't having any abdominal issues, and this enzyme is non-specific. We will check it again on her next set of labs.

## 2021-05-06 ENCOUNTER — Encounter: Payer: Self-pay | Admitting: Nurse Practitioner

## 2021-05-11 ENCOUNTER — Encounter: Payer: Self-pay | Admitting: Internal Medicine

## 2021-05-11 ENCOUNTER — Other Ambulatory Visit: Payer: Self-pay

## 2021-05-11 ENCOUNTER — Ambulatory Visit (INDEPENDENT_AMBULATORY_CARE_PROVIDER_SITE_OTHER): Payer: Medicare Other | Admitting: Internal Medicine

## 2021-05-11 ENCOUNTER — Other Ambulatory Visit: Payer: Self-pay | Admitting: *Deleted

## 2021-05-11 DIAGNOSIS — N3000 Acute cystitis without hematuria: Secondary | ICD-10-CM

## 2021-05-11 DIAGNOSIS — R300 Dysuria: Secondary | ICD-10-CM | POA: Diagnosis not present

## 2021-05-11 LAB — POCT URINALYSIS DIP (CLINITEK)
Glucose, UA: NEGATIVE mg/dL
Nitrite, UA: NEGATIVE
POC PROTEIN,UA: 30 — AB
Spec Grav, UA: >=1.03 — AB (ref 1.010–1.025)
Urobilinogen, UA: 1 E.U./dL
pH, UA: 6.5 (ref 5.0–8.0)

## 2021-05-11 MED ORDER — NITROFURANTOIN MONOHYD MACRO 100 MG PO CAPS: 100.0000 mg | ORAL_CAPSULE | Freq: Two times a day (BID) | ORAL | 0 refills | Status: DC

## 2021-05-11 NOTE — Progress Notes (Addendum)
Virtual Visit via Telephone Note   This visit type was conducted due to national recommendations for restrictions regarding the COVID-19 Pandemic (e.g. social distancing) in an effort to limit this patient's exposure and mitigate transmission in our community.  Due to her co-morbid illnesses, this patient is at least at moderate risk for complications without adequate follow up.  This format is felt to be most appropriate for this patient at this time.  The patient did not have access to video technology/had technical difficulties with video requiring transitioning to audio format only (telephone).  All issues noted in this document were discussed and addressed.  No physical exam could be performed with this format.  Evaluation Performed:  Follow-up visit  Date:  05/11/2021   ID:  Emma Roberts, DOB 1955-11-19, MRN [id number]  Patient Location: Home Provider Location: Office/Clinic  Location of Patient: Home Location of Provider: Telehealth Consent was obtain for visit to be over via telehealth. I verified that I am speaking with the correct person using two identifiers.  PCP:  Noreene Larsson, NP   Chief Complaint: Dysuria  History of Present Illness:    Emma Roberts is a 65 y.o. female who has a televisit for c/o dysuria and urinary frequency for the last 3 days. She denies any fever, chills, nausea or vomiting. Denies any hematuria or flank pain.  The patient does not have symptoms concerning for COVID-19 infection (fever, chills, cough, or new shortness of breath).   Past Medical, Surgical, Social History, Allergies, and Medications have been Reviewed.  Past Medical History:  Diagnosis Date   Cancer ([place])    breast cancer in the past   COVID-19 12/29/2020   GERD (gastroesophageal reflux disease)    High cholesterol    Past Surgical History:  Procedure Laterality Date   BREAST SURGERY     mastectomy and reconstruction using belly fat      Current Meds  Medication Sig    esomeprazole (NEXIUM) 40 MG capsule Take 1 capsule (40 mg total) by mouth daily at 12 noon.   FLUoxetine (PROZAC) 20 MG capsule Take 1 capsule (20 mg total) by mouth daily.   fluticasone (FLONASE) 50 MCG/ACT nasal spray Place 2 sprays into both nostrils daily.   ibuprofen (ADVIL) 600 MG tablet Take 1 tablet (600 mg total) by mouth every 8 (eight) hours as needed for headache, mild pain or moderate pain.   Misc Natural Products (NEURIVA PO) Take 2 each by mouth.   Multiple Vitamin (MULTIVITAMIN PO) Take 2 each by mouth.   Multiple Vitamins-Minerals (ALGAE BASED CALCIUM PO) Take 2 each by mouth daily. Algae Calcium Plus   Multiple Vitamins-Minerals (ALGAE BASED CALCIUM) TABS Take 2 each by mouth at bedtime. Algae Cal Strontium   rosuvastatin (CRESTOR) 20 MG tablet Take 1 tablet (20 mg total) by mouth daily.     Allergies:   Sulfamethoxazole-trimethoprim   ROS:   Please see the history of present illness.     All other systems reviewed and are negative.   Labs/Other Tests and Data Reviewed:    Recent Labs: 10/13/2020: TSH 2.170 04/21/2021: ALT 19; BUN 14; Creatinine, Ser 0.92; Hemoglobin 14.4; Platelets 274; Potassium 4.7; Sodium 143   Recent Lipid Panel Lab Results  Component Value Date/Time   CHOL 181 04/21/2021 09:30 AM   TRIG 98 04/21/2021 09:30 AM   HDL 70 04/21/2021 09:30 AM   LDLCALC 93 04/21/2021 09:30 AM    Wt Readings from Last 3 Encounters:  04/21/21 169  lb 0.6 oz (76.7 kg)  02/25/21 167 lb (75.8 kg)  12/09/20 166 lb (75.3 kg)     ASSESSMENT & PLAN:    UTI UA reviewed Check urine culture Started Macrobid for now - allergic to Bactrim Advised to increase fluid intake  Addendum: Started Ciprofloxacin instead of Macrobid as patient allergic reaction - itching with Macrobid.  Time:   Today, I have spent 7 minutes reviewing the chart, including problem list, medications, and with the patient with telehealth technology discussing the above  problems.   Medication Adjustments/Labs and Tests Ordered: Current medicines are reviewed at length with the patient today.  Concerns regarding medicines are outlined above.   Tests Ordered: Orders Placed This Encounter  Procedures   POCT URINALYSIS DIP (CLINITEK)    Medication Changes: No orders of the defined types were placed in this encounter.    Note: This dictation was prepared with Dragon dictation along with smaller phrase technology. Similar sounding words can be transcribed inadequately or may not be corrected upon review. Any transcriptional errors that result from this process are unintentional.      Disposition:  Follow up  Signed, Lindell Spar, MD  05/11/2021 3:03 PM     [facility]

## 2021-05-11 NOTE — Patient Instructions (Signed)

## 2021-05-12 ENCOUNTER — Encounter: Payer: Self-pay | Admitting: *Deleted

## 2021-05-12 ENCOUNTER — Telehealth: Payer: Self-pay | Admitting: Nurse Practitioner

## 2021-05-12 MED ORDER — CIPROFLOXACIN HCL 500 MG PO TABS: 500.0000 mg | ORAL_TABLET | Freq: Every day | ORAL | 0 refills | Status: AC

## 2021-05-12 NOTE — Addendum Note (Signed)
Addended byIhor Dow on: 05/12/2021 12:14 PM   Modules accepted: Orders

## 2021-05-12 NOTE — Telephone Encounter (Signed)
Pt allergy added to chart and notified that this was sent in with verbal understanding

## 2021-05-12 NOTE — Telephone Encounter (Signed)
Needed to know what kind of symptoms for allergic reaction she started itching all over after taking the medication that was sent yesterday   Please let me know if you would like to change medication

## 2021-05-12 NOTE — Telephone Encounter (Signed)
Pt called in with allergic reaction to meds prescribed for bladder infection yesterday , wants to see if she can get a different prescription sent in for her.

## 2021-05-19 ENCOUNTER — Telehealth: Payer: Self-pay | Admitting: Nurse Practitioner

## 2021-05-19 NOTE — Telephone Encounter (Signed)
Pt called in wanting a referral to a urologist for alzheimer's. Pt wants to use [facility]  Phone # [phone] ext [phone]  Fax : [phone]

## 2021-05-19 NOTE — Telephone Encounter (Signed)
Was referred in March 2022 to [facility]. Wants to be referred to [facility] instead. Ok to enter referral?

## 2021-05-20 ENCOUNTER — Other Ambulatory Visit: Payer: Self-pay

## 2021-05-20 DIAGNOSIS — R413 Other amnesia: Secondary | ICD-10-CM

## 2021-05-21 LAB — URINE CULTURE

## 2021-05-25 ENCOUNTER — Telehealth: Payer: Self-pay | Admitting: Nurse Practitioner

## 2021-05-25 NOTE — Telephone Encounter (Signed)
faxed

## 2021-05-25 NOTE — Telephone Encounter (Signed)
Janeth Rase [facility] neurology called in on pt behalf  requesting face to face notes on pt.  Pt is scheduled in their office on 11/16 and will need face to face notes sent over before the appointment   [place] [facility] [phone] ext [phone] Fax # [phone]

## 2021-08-03 ENCOUNTER — Telehealth: Payer: Self-pay | Admitting: Nurse Practitioner

## 2021-08-03 NOTE — Telephone Encounter (Signed)
error 

## 2021-08-04 ENCOUNTER — Encounter: Payer: Self-pay | Admitting: Nurse Practitioner

## 2021-08-04 ENCOUNTER — Other Ambulatory Visit: Payer: Self-pay | Admitting: Nurse Practitioner

## 2021-08-04 ENCOUNTER — Telehealth: Payer: Self-pay | Admitting: Nurse Practitioner

## 2021-08-04 ENCOUNTER — Other Ambulatory Visit: Payer: Self-pay | Admitting: Internal Medicine

## 2021-08-04 ENCOUNTER — Other Ambulatory Visit: Payer: Self-pay | Admitting: *Deleted

## 2021-08-04 DIAGNOSIS — N3000 Acute cystitis without hematuria: Secondary | ICD-10-CM

## 2021-08-04 DIAGNOSIS — E785 Hyperlipidemia, unspecified: Secondary | ICD-10-CM

## 2021-08-04 MED ORDER — ESOMEPRAZOLE MAGNESIUM 40 MG PO CPDR: 40.0000 mg | DELAYED_RELEASE_CAPSULE | Freq: Every day | ORAL | 3 refills | Status: DC

## 2021-08-04 MED ORDER — FLUOXETINE HCL 20 MG PO CAPS: 20.0000 mg | ORAL_CAPSULE | Freq: Every day | ORAL | 3 refills | Status: DC

## 2021-08-04 MED ORDER — ATORVASTATIN CALCIUM 40 MG PO TABS: 40.0000 mg | ORAL_TABLET | Freq: Every day | ORAL | 0 refills | Status: DC

## 2021-08-04 MED ORDER — ROSUVASTATIN CALCIUM 20 MG PO TABS: 20.0000 mg | ORAL_TABLET | Freq: Every day | ORAL | 3 refills | Status: DC

## 2021-08-04 NOTE — Telephone Encounter (Signed)
Medications have been sent into pharmacy

## 2021-08-04 NOTE — Telephone Encounter (Signed)
Pt needs refills on   Prozac Nexium  Rouvastatin

## 2021-08-05 ENCOUNTER — Telehealth: Payer: Self-pay

## 2021-08-05 ENCOUNTER — Other Ambulatory Visit: Payer: Self-pay | Admitting: *Deleted

## 2021-08-05 MED ORDER — ATORVASTATIN CALCIUM 40 MG PO TABS: 40.0000 mg | ORAL_TABLET | Freq: Every day | ORAL | 0 refills | Status: DC

## 2021-08-05 NOTE — Telephone Encounter (Signed)
Called and spoke with Nicki Reaper at [facility] notified him patient should be taking Atorvastatin and not Rosuvastatin. He verbalized understanding and updated there system

## 2021-08-05 NOTE — Telephone Encounter (Signed)
Scott from [facility] called received prescription for Rosuvastatin 20 mg and esomeprazole 40 mg, asking which medicine does the provider want the patient to take. Please return call back [phone].

## 2021-08-06 ENCOUNTER — Other Ambulatory Visit: Payer: Self-pay | Admitting: Nurse Practitioner

## 2021-08-15 ENCOUNTER — Telehealth: Payer: Self-pay

## 2021-08-16 NOTE — Telephone Encounter (Signed)
Pt states she is taking Atorvastatin, confirmation for [facility]

## 2021-10-12 ENCOUNTER — Encounter: Payer: Self-pay | Admitting: Neurology

## 2021-10-19 ENCOUNTER — Ambulatory Visit: Payer: Medicare Other | Admitting: Nurse Practitioner

## 2021-10-24 ENCOUNTER — Ambulatory Visit: Payer: Medicare Other | Admitting: Nurse Practitioner

## 2021-10-26 ENCOUNTER — Ambulatory Visit: Payer: Medicare Other | Admitting: Nurse Practitioner

## 2021-11-01 DIAGNOSIS — Z0100 Encounter for examination of eyes and vision without abnormal findings: Secondary | ICD-10-CM | POA: Diagnosis not present

## 2021-11-01 DIAGNOSIS — H524 Presbyopia: Secondary | ICD-10-CM | POA: Diagnosis not present

## 2021-11-04 ENCOUNTER — Other Ambulatory Visit: Payer: Self-pay | Admitting: Nurse Practitioner

## 2021-11-08 ENCOUNTER — Other Ambulatory Visit: Payer: Self-pay | Admitting: Nurse Practitioner

## 2021-11-23 ENCOUNTER — Encounter: Payer: Self-pay | Admitting: Nurse Practitioner

## 2021-11-23 ENCOUNTER — Ambulatory Visit (INDEPENDENT_AMBULATORY_CARE_PROVIDER_SITE_OTHER): Payer: Medicare HMO | Admitting: Nurse Practitioner

## 2021-11-23 VITALS — BP 120/73 | HR 74 | Ht 62.0 in | Wt 166.0 lb

## 2021-11-23 DIAGNOSIS — T7840XA Allergy, unspecified, initial encounter: Secondary | ICD-10-CM

## 2021-11-23 DIAGNOSIS — Z1231 Encounter for screening mammogram for malignant neoplasm of breast: Secondary | ICD-10-CM

## 2021-11-23 DIAGNOSIS — E785 Hyperlipidemia, unspecified: Secondary | ICD-10-CM

## 2021-11-23 DIAGNOSIS — H6503 Acute serous otitis media, bilateral: Secondary | ICD-10-CM | POA: Diagnosis not present

## 2021-11-23 DIAGNOSIS — F322 Major depressive disorder, single episode, severe without psychotic features: Secondary | ICD-10-CM

## 2021-11-23 DIAGNOSIS — R69 Illness, unspecified: Secondary | ICD-10-CM | POA: Diagnosis not present

## 2021-11-23 MED ORDER — FLUTICASONE PROPIONATE 50 MCG/ACT NA SUSP: 2.0000 | Freq: Every day | NASAL | 6 refills | Status: DC

## 2021-11-23 NOTE — Progress Notes (Addendum)
? ?  Emma Roberts     MRN: [id number]      DOB: 11/20/1955 ? ? ?HPI ?Emma Roberts with past medical history of GERD, hyperlipidemia, depression is here for follow up and re-evaluation of chronic medical conditions, ? ?The PT denies any adverse reactions to current medications since the last visit.  ? ? ?Pt c/o congestion, sneezing, scratchy throat throat, stuffy nose , was previously taking Flonase but she has not had med in a while. Her symptoms gets worse during the winter amonths.  ? ? ?She had stopped taking her Prozac patient denies depression today.  ? ?Due for mammogram, has history of right breast cancer in her 19s, she had right breast mastectomy with reconstruction surgery and treatment when she was diagnosed.  Screening mammogram ordered today.  ? ?ROS ?Denies recent fever or chills. ?Denies sinus pressure, nasal congestion, ear pain or sore throat. ?Denies chest congestion, productive cough or wheezing. ?Denies chest pains, palpitations and leg swelling ?Denies abdominal pain, nausea, vomiting,diarrhea or constipation.   ?Denies dysuria, frequency, hesitancy or incontinence. ?Denies joint pain, swelling and limitation in mobility. ?Denies depression, anxiety or insomnia. ? ? ?PE ? ?BP 120/73 (BP Location: Right Arm, Patient Position: Sitting, Cuff Size: Large)   Pulse 74   Ht '5\' 2"'$  (1.575 m)   Wt 166 lb (75.3 kg)   SpO2 98%   BMI 30.36 kg/m?  ? ?Patient alert and oriented and in no cardiopulmonary distress. ? ?Chest: Clear to auscultation bilaterally. ? ?CVS: S1, S2 no murmurs, no S3.Regular rate. ? ?ABD: Soft non tender.  ? ?Ext: No edema ? ?MS: Adequate ROM spine, shoulders, hips and knees. ? ?Skin: Intact, no ulcerations or rash noted. ? ?Psych: Good eye contact, normal affect. Memory intact not anxious or depressed appearing. ? ? ? ? ?Assessment & Plan ? ?Hyperlipidemia ?Taking atorvastatin 40 mg daily ?Check lipid panel  ?Avoid fried fatty foods ? ?Depression, major, single episode, severe  ([place]) ?Patient denies depression, PHQ-9 score 0 ?States that she had stopped taking prozac, okay to stay without medication at this time, will reassess at next visit for medication.  ?Denies SI, HI ? ?Allergies ?Flonase nasal spray refilled. ?Avoid allergens ?  ?

## 2021-11-23 NOTE — Patient Instructions (Signed)
Please get your shingles vaccine at your pharmacy     It is important that you exercise regularly at least 30 minutes 5 times a week.  Think about what you will eat, plan ahead. Choose " clean, green, fresh or frozen" over canned, processed or packaged foods which are more sugary, salty and fatty. 70 to 75% of food eaten should be vegetables and fruit. Three meals at set times with snacks allowed between meals, but they must be fruit or vegetables. Aim to eat over a 12 hour period , example 7 am to 7 pm, and STOP after  your last meal of the day. Drink water,generally about 64 ounces per day, no other drink is as healthy. Fruit juice is best enjoyed in a healthy way, by EATING the fruit.  Thanks for choosing [facility], we consider it a privelige to serve you.  

## 2021-11-23 NOTE — Assessment & Plan Note (Addendum)
Flonase nasal spray refilled. ?Avoid allergens ? ?

## 2021-11-23 NOTE — Assessment & Plan Note (Signed)
Taking atorvastatin 40 mg daily ?Check lipid panel  ?Avoid fried fatty foods ?

## 2021-11-23 NOTE — Assessment & Plan Note (Addendum)
Patient denies depression, PHQ-9 score 0 ?States that she had stopped taking prozac, okay to stay without medication at this time, will reassess at next visit for medication need.  ?Denies SI, HI ?

## 2021-11-26 ENCOUNTER — Other Ambulatory Visit: Payer: Self-pay | Admitting: Nurse Practitioner

## 2021-12-06 ENCOUNTER — Encounter: Payer: Self-pay | Admitting: Neurology

## 2021-12-06 ENCOUNTER — Ambulatory Visit (INDEPENDENT_AMBULATORY_CARE_PROVIDER_SITE_OTHER): Payer: Medicare HMO | Admitting: Neurology

## 2021-12-06 VITALS — BP 117/73 | HR 73 | Ht 62.0 in | Wt 166.2 lb

## 2021-12-06 DIAGNOSIS — R4189 Other symptoms and signs involving cognitive functions and awareness: Secondary | ICD-10-CM

## 2021-12-06 DIAGNOSIS — R41842 Visuospatial deficit: Secondary | ICD-10-CM | POA: Diagnosis not present

## 2021-12-06 DIAGNOSIS — R0683 Snoring: Secondary | ICD-10-CM

## 2021-12-06 DIAGNOSIS — G4750 Parasomnia, unspecified: Secondary | ICD-10-CM

## 2021-12-06 DIAGNOSIS — R413 Other amnesia: Secondary | ICD-10-CM | POA: Diagnosis not present

## 2021-12-06 DIAGNOSIS — R6889 Other general symptoms and signs: Secondary | ICD-10-CM | POA: Diagnosis not present

## 2021-12-06 DIAGNOSIS — R419 Unspecified symptoms and signs involving cognitive functions and awareness: Secondary | ICD-10-CM | POA: Diagnosis not present

## 2021-12-06 DIAGNOSIS — R400 Somnolence: Secondary | ICD-10-CM

## 2021-12-06 NOTE — Patient Instructions (Addendum)
MRI of brain w/wo contrast - will call  (give xanax) ?EEG and after may consider a 24-hour eeg - schedule on the way out ?Blood work ?Formal neurocognitive testing - call you ?Sleep evaluation - Dr. Brett Fairy  ?After all is completed, if needed, another test called FDG PET Scan for dementia ? ?Dementia ?Dementia is a condition that affects the way the brain functions. It often affects memory and thinking. Usually, dementia gets worse with time and cannot be reversed (progressive dementia). There are many types of dementia, including: ?Alzheimer's disease. This type is the most common. ?Vascular dementia. This type may happen as the result of a stroke. ?Lewy body dementia. This type may happen to people who have Parkinson's disease. ?Frontotemporal dementia. This type is caused by damage to nerve cells (neurons) in certain parts of the brain. ?Some people may be affected by more than one type of dementia. This is called mixed dementia. ?What are the causes? ?Dementia is caused by damage to cells in the brain. The area of the brain and the types of cells damaged determine the type of dementia. Usually, this damage is irreversible or cannot be undone. Some examples of irreversible causes include: ?Conditions that affect the blood vessels of the brain, such as diabetes, heart disease, or blood vessel disease. ?Genetic mutations. ?In some cases, changes in the brain may be caused by another condition and can be reversed or slowed. Some examples of reversible causes include: ?Injury to the brain. ?Certain medicines. ?Infection, such as meningitis. ?Metabolic problems, such as vitamin B12 deficiency or thyroid disease. ?Pressure on the brain, such as from a tumor, blood clot, or too much fluid in the brain (hydrocephalus). ?Autoimmune diseases that affect the brain or arteries, such as limbic encephalitis or vasculitis. ?What are the signs or symptoms? ?Symptoms of dementia depend on the type of dementia. Common signs of  dementia include problems with remembering, thinking, problem solving, decision making, and communicating. These signs develop slowly or get worse with time. This may include: ?Problems remembering events or people. ?Having trouble taking a bath or putting clothes on. ?Forgetting appointments or forgetting to pay bills. ?Difficulty planning and preparing meals. ?Having trouble speaking. ?Getting lost easily. ?Changes in behavior or mood. ?How is this diagnosed? ?This condition is diagnosed by a specialist (neurologist). It is diagnosed based on the history of your symptoms, your medical history, a physical exam, and tests. Tests may include: ?Tests to evaluate brain function, such as memory tests, cognitive tests, and other tests. ?Lab tests, such as blood or urine tests. ?Imaging tests, such as a CT scan, a PET scan, or an MRI. ?Genetic testing. This may be done if other family members have a diagnosis of certain types of dementia. ?Your health care provider will talk with you and your family, friends, or caregivers about your history and symptoms. ?How is this treated? ?Treatment for this condition depends on the cause of the dementia. Progressive dementias, such as Alzheimer's disease, cannot be cured, but there may be treatments that help to manage symptoms. ?Treatment might involve taking medicines that may help to: ?Control the dementia. ?Slow down the progression of the dementia. ?Manage symptoms. ?In some cases, treating the cause of your dementia can improve symptoms, reverse symptoms, or slow down how quickly your dementia becomes worse. ?Your health care provider can direct you to support groups, organizations, and other health care providers who can help with decisions about your care. ?Follow these instructions at home: ?Medicines ?Take over-the-counter and prescription medicines  only as told by your health care provider. ?Use a pill organizer or pill reminder to help you manage your medicines. ?Avoid  taking medicines that can affect thinking, such as pain medicines or sleeping medicines. ?Lifestyle ?Make healthy lifestyle choices. ?Be physically active as told by your health care provider. ?Do not use any products that contain nicotine or tobacco, such as cigarettes, e-cigarettes, and chewing tobacco. If you need help quitting, ask your health care provider. ?Do not drink alcohol. ?Practice stress-management techniques when you get stressed. ?Spend time with other people. ?Make sure to get quality sleep. These tips can help you get a good night's rest: ?Avoid napping during the day. ?Keep your sleeping area dark and cool. ?Avoid exercising during the few hours before you go to bed. ?Avoid caffeine products in the evening. ?Eating and drinking ?Drink enough fluid to keep your urine pale yellow. ?Eat a healthy diet. ?General instructions ? ?Work with your health care provider to determine what you need help with and what your safety needs are. ?Talk with your health care provider about whether it is safe for you to drive. ?If you were given a bracelet that identifies you as a person with memory loss or tracks your location, make sure to wear it at all times. ?Work with your family to make important decisions, such as advance directives, medical power of attorney, or a living will. ?Keep all follow-up visits. This is important. ?Where to find more information ?Alzheimer's Association: CapitalMile.co.nz ?[employer] on Aging: DVDEnthusiasts.nl ?World Health Organization: RoleLink.com.br ?Contact a health care provider if: ?You have any new or worsening symptoms. ?You have problems with choking or swallowing. ?Get help right away if: ?You feel depressed or sad, or feel that you want to harm yourself. ?Your family members become concerned for your safety. ?If you ever feel like you may hurt yourself or others, or have thoughts about taking your own life, get help right away. Go to your nearest emergency  department or: ?Call your local emergency services (911 in the [place] ?Call a suicide crisis helpline, such as the [place] at (506)564-5483 or 988 in the [place]. This is open 24 hours a day in the [place] ?Text the Crisis Text Line at (442)201-4678 (in the [place].). ?Summary ?Dementia is a condition that affects the way the brain functions. Dementia often affects memory and thinking. ?Usually, dementia gets worse with time and cannot be reversed (progressive dementia). ?Treatment for this condition depends on the cause of the dementia. ?Work with your health care provider to determine what you need help with and what your safety needs are. ?Your health care provider can direct you to support groups, organizations, and other health care providers who can help with decisions about your care. ?This information is not intended to replace advice given to you by your health care provider. Make sure you discuss any questions you have with your health care provider. ?Document Revised: 02/02/2021 Document Reviewed: 11/24/2019 ?Elsevier Patient Education ? [place]. ? ? ?

## 2021-12-06 NOTE — Progress Notes (Signed)
?[facility] ? ? ? ?Provider:  Dr Jaynee Eagles ?Requesting Provider: No ref. provider found ?Primary Care Provider:  Noreene Larsson, NP ? ?CC:  Memory loss ? ?12/06/2021: May 2023 again today for subjective memory complaints, she was last seen in May 2022, at that time I recommended a work-up and she declined, I referred her to the healthy brain study and discussed dementia with her and her daughter. She called in March to get records to see Dr. Merlene Laughter who is another neurologist, I spoke with daughter Emma Roberts who has no idea who patient saw.  I advised patient should really only be seen 1 neurologist.  I reviewed "care everywhere" and I see that she has been to the [facility] clinic for fatty liver abnormal enzymes hepatitis, wound care, oncology in the past but I do not see any neurology notes.  She has a past medical history of GERD, subjective memory complaints, numbness of tongue, memory loss, insomnia, hyperlipidemia, recurrent depression, allergies. ? ? Here with both daughters who provide much information. She started having issues about 4 years ago. She has been cabcer free for 14-15 years. Progressive. A lot more short-term memory loss. She is still having difficulty navigating, she can ask a question or a story and then tell it again. Still losing things. Forgetting what she had for lunch. See notes from 12/09/2020 below are similar but worse. No delusions or hallucinations. No changes in personality, but a few years ago she was at the beach and doesn't remember it she trid to kill herself she said she was going to walk into the ocean and never came back and she was inher nightgown and it took an hour to get home she couldn't remember how to get home. She is better with her depression and anxiety, she is being treated, she still feels down. No drugs or alcohol use. No Fhx of dementia. I recommended the Healthy [place] Study and they did not go and gave them a book about dementia and they didn't get  a chance to read it. She is paying own bills amd not missing any bills, daughters disagree, she takes her medications independently. ? ?She snores loudly, talks in sleep, screams in her sleep and her daughters have heard it, she taks naps during the day (patient denies but daughters say she sleeps "like the dead" even during the day and they have to shake her up).  ? ?Reviewed B12 labs in the past has been normal, TSH normal 09/2020 ? ?Patient complains of symptoms per HPI as well as the following symptoms: progressive memory loss . Pertinent negatives and positives per HPI. All others negative ? ? ? ?HPI 12/09/2020:  Emma Roberts is a 66 y.o. female here as requested by No ref. provider found for memory loss. PMHx breast cancer, high cholesterol, depression, insomnia.  ? ?Here with her daughter who provides information.  Memory loss started in the past couple years.  She notices it daily.  She has to write notes that she will forget.  She gets lost driving more than in the past.  She can states you could tell her something and 10 minutes later she forgets. Patient says she is a breast cancer survivor 3 years started noticing memory decline, she would get up to do something and forgot, losing things, puts things where they don't go and can't find them, get lunch and forgot what she had 2 hours ago, she recently moved here to live with her daughter last November from [employer]  beach but grew up in [place]. Daughter says she started noticing it about 1.5 years ago, short term memory loss, she would forget things in a few hours, she would commit to doing things and then forget, more short-term memory loss but also some long term like forgetting where her son in law works and he has been there several years. She pays her own bills and doesn't miss it. She takes her own medications and doesn't miss anything. She will go somewhere and forget where she is going, she forgot how to get home. Mother died 10s without dementia,  father died in early 25s, she has a family of 8 kids, she is in the middle, no one has been diagnosed with dementia. No drugs or significant alcohol use. She tries to walk. She tried to kill herself last year, she does feel alone, she has been alone all her life and she doesn't have as many friends, she worries about living alone, she is tired of being alone. She is tired but that is normal, no excessive daytime.  ? ? ?Reviewed notes, labs and imaging from outside physicians, which showed:  ? ?  I reviewed Sharlynn Oliphant notes: Patient had 2 visits in March of this year with complaints of memory loss, she noticed some short-term memory loss and also some long-term memory loss, stated she could not remember what she eaten today, she is got lost in the car and could not find her way home, she been taking supplements to help her memory, MMSE was 25 out of 30.  Patient feeling this is progressive, moving in with her daughter due to her memory issues, worried about dementia.  Her medical record showed that she had a suicide attempt and was hospitalized in October 2021, severe depression and unclear if it was treated appropriately, she has been forgetting items she needs to purchase at the store, she reported being stressed and depressed.  CBC, CMP, HCV, TSH, vitamin D and B12 were checked, I reviewed and they were unremarkable all essentially normal b12 was 577. ? ? ?Review of Systems: ?Patient complains of symptoms per HPI as well as the following symptoms: memory loss. Pertinent negatives and positives per HPI. All others negative. ? ? ?Social History  ? ?Socioeconomic History  ? Marital status: Single  ?  Spouse name: Not on file  ? Number of children: Not on file  ? Years of education: Not on file  ? Highest education level: Some college, no degree  ?Occupational History  ? Not on file  ?Tobacco Use  ? Smoking status: Never  ? Smokeless tobacco: Never  ?Vaping Use  ? Vaping Use: Never used  ?Substance and Sexual  Activity  ? Alcohol use: Not Currently  ?  Roberts: very rarely   ? Drug use: Never  ? Sexual activity: Not on file  ?Other Topics Concern  ? Not on file  ?Social History Narrative  ? Lives with her daughter  ? Left handed  ? Caffeine: 0  ? ?Social Determinants of Health  ? ?Financial Resource Strain: Low Risk   ? Difficulty of Paying Living Expenses: Not hard at all  ?Food Insecurity: No Food Insecurity  ? Worried About Charity fundraiser in the Last Year: Never true  ? Ran Out of Food in the Last Year: Never true  ?Transportation Needs: No Transportation Needs  ? Lack of Transportation (Medical): No  ? Lack of Transportation (Non-Medical): No  ?Physical Activity: Sufficiently Active  ? Days of  Exercise per Week: 5 days  ? Minutes of Exercise per Session: 50 min  ?Stress: No Stress Concern Present  ? Feeling of Stress : Not at all  ?Social Connections: Socially Isolated  ? Frequency of Communication with Friends and Family: More than three times a week  ? Frequency of Social Gatherings with Friends and Family: More than three times a week  ? Attends Religious Services: Never  ? Active Member of Clubs or Organizations: No  ? Attends Archivist Meetings: Never  ? Marital Status: Divorced  ?Intimate Partner Violence: Not At Risk  ? Fear of Current or Ex-Partner: No  ? Emotionally Abused: No  ? Physically Abused: No  ? Sexually Abused: No  ? ? ?Family History  ?Problem Relation Age of Onset  ? Stroke Other   ? Heart attack Other   ? Alzheimer's disease Neg Hx   ? Dementia Neg Hx   ? ? ?Past Medical History:  ?Diagnosis Date  ? Cancer [facility])   ? breast cancer in the past  ? COVID-19 12/29/2020  ? GERD (gastroesophageal reflux disease)   ? High cholesterol   ? ? ?Patient Active Problem List  ? Diagnosis Date Noted  ? Allergies 11/23/2021  ? Numbness of tongue 04/21/2021  ? Serous otitis media 02/25/2021  ? Subjective memory complaints 12/09/2020  ? Depression, major, single episode, severe ([place]) 10/20/2020  ?  Encounter for general adult medical examination with abnormal findings 10/04/2020  ? Screening due 10/04/2020  ? GERD (gastroesophageal reflux disease) 10/04/2020  ? Insomnia 10/04/2020  ? Hyperlipidemia 03/14/

## 2021-12-07 ENCOUNTER — Telehealth: Payer: Self-pay | Admitting: Neurology

## 2021-12-07 NOTE — Telephone Encounter (Signed)
[employer] medicare sent to [facility] they obtain auth and call patient to schedule ?

## 2021-12-07 NOTE — Telephone Encounter (Signed)
Referral for Neuropsychology sent to Dr. Alphonzo Severance [phone]. ?

## 2021-12-09 ENCOUNTER — Ambulatory Visit
Admission: RE | Admit: 2021-12-09 | Discharge: 2021-12-09 | Disposition: A | Discharge status: Home / Self Care | Payer: Medicare HMO | Source: Ambulatory Visit | Attending: Nurse Practitioner | Admitting: Nurse Practitioner

## 2021-12-09 ENCOUNTER — Other Ambulatory Visit: Payer: Self-pay | Admitting: Nurse Practitioner

## 2021-12-09 DIAGNOSIS — Z1231 Encounter for screening mammogram for malignant neoplasm of breast: Secondary | ICD-10-CM | POA: Diagnosis not present

## 2021-12-12 LAB — CBC WITH DIFFERENTIAL/PLATELET
Basophils Absolute: 0.1 10*3/uL (ref 0.0–0.2)
Basos: 1 %
EOS (ABSOLUTE): 0.1 10*3/uL (ref 0.0–0.4)
Eos: 1 %
Hematocrit: 44.3 % (ref 34.0–46.6)
Hemoglobin: 15.4 g/dL (ref 11.1–15.9)
Immature Grans (Abs): 0 10*3/uL (ref 0.0–0.1)
Immature Granulocytes: 0 %
Lymphocytes Absolute: 2 10*3/uL (ref 0.7–3.1)
Lymphs: 25 %
MCH: 32.1 pg (ref 26.6–33.0)
MCHC: 34.8 g/dL (ref 31.5–35.7)
MCV: 92 fL (ref 79–97)
Monocytes Absolute: 0.7 10*3/uL (ref 0.1–0.9)
Monocytes: 9 %
Neutrophils Absolute: 5.1 10*3/uL (ref 1.4–7.0)
Neutrophils: 64 %
Platelets: 305 10*3/uL (ref 150–450)
RBC: 4.8 x10E6/uL (ref 3.77–5.28)
RDW: 12.9 % (ref 11.7–15.4)
WBC: 8 10*3/uL (ref 3.4–10.8)

## 2021-12-12 LAB — COMPREHENSIVE METABOLIC PANEL
ALT: 31 IU/L (ref 0–32)
AST: 32 IU/L (ref 0–40)
Albumin/Globulin Ratio: 1.8 (ref 1.2–2.2)
Albumin: 4.6 g/dL (ref 3.8–4.8)
Alkaline Phosphatase: 133 IU/L — ABNORMAL HIGH (ref 44–121)
BUN/Creatinine Ratio: 20 (ref 12–28)
BUN: 19 mg/dL (ref 8–27)
Bilirubin Total: 0.6 mg/dL (ref 0.0–1.2)
CO2: 24 mmol/L (ref 20–29)
Calcium: 10.6 mg/dL — ABNORMAL HIGH (ref 8.7–10.3)
Chloride: 100 mmol/L (ref 96–106)
Creatinine, Ser: 0.96 mg/dL (ref 0.57–1.00)
Globulin, Total: 2.6 g/dL (ref 1.5–4.5)
Glucose: 82 mg/dL (ref 70–99)
Potassium: 4.5 mmol/L (ref 3.5–5.2)
Sodium: 139 mmol/L (ref 134–144)
Total Protein: 7.2 g/dL (ref 6.0–8.5)
eGFR: 65 mL/min/{1.73_m2} (ref >59)

## 2021-12-12 LAB — TSH: TSH: 2.41 u[IU]/mL (ref 0.450–4.500)

## 2021-12-12 LAB — VITAMIN B1: Thiamine: 163.4 nmol/L (ref 66.5–200.0)

## 2021-12-12 LAB — RPR: RPR Ser Ql: NONREACTIVE

## 2021-12-12 LAB — B12 AND FOLATE PANEL
Folate: >20 ng/mL (ref >3.0)
Vitamin B-12: 908 pg/mL (ref 232–1245)

## 2021-12-12 LAB — HOMOCYSTEINE: Homocysteine: 9.6 umol/L (ref 0.0–17.2)

## 2021-12-12 LAB — METHYLMALONIC ACID, SERUM: Methylmalonic Acid: 151 nmol/L (ref 0–378)

## 2021-12-13 ENCOUNTER — Ambulatory Visit (INDEPENDENT_AMBULATORY_CARE_PROVIDER_SITE_OTHER): Payer: Medicare HMO | Admitting: Neurology

## 2021-12-13 DIAGNOSIS — R4182 Altered mental status, unspecified: Secondary | ICD-10-CM | POA: Diagnosis not present

## 2021-12-13 DIAGNOSIS — R413 Other amnesia: Secondary | ICD-10-CM

## 2021-12-13 NOTE — Procedures (Signed)
    History:  66 year old woman with memory impairment  EEG classification: Awake and drowsy  Description of the recording: The background rhythms of this recording consists of a fairly well modulated medium amplitude alpha rhythm of 10 Hz that is reactive to eye opening and closure. As the record progresses, the patient appears to remain in the waking state throughout the recording. Photic stimulation was performed, did not show any abnormalities. Hyperventilation was also performed, did not show any abnormalities. Toward the end of the recording, the patient enters the drowsy state with slight symmetric slowing seen. The patient never enters stage II sleep. No abnormal epileptiform discharges seen during this recording. There was no focal slowing. EKG monitor shows no evidence of cardiac rhythm abnormalities with a heart rate of 66.  Abnormality: None   Impression: This is a normal EEG recording in the waking and drowsy state. No evidence of interictal epileptiform discharges seen. A normal EEG does not exclude a diagnosis of epilepsy.    Alric Ran, MD [facility]

## 2021-12-16 ENCOUNTER — Ambulatory Visit
Admission: RE | Admit: 2021-12-16 | Discharge: 2021-12-16 | Disposition: A | Discharge status: Home / Self Care | Payer: Medicare HMO | Source: Ambulatory Visit | Attending: Neurology | Admitting: Neurology

## 2021-12-16 DIAGNOSIS — R413 Other amnesia: Secondary | ICD-10-CM

## 2021-12-16 DIAGNOSIS — R419 Unspecified symptoms and signs involving cognitive functions and awareness: Secondary | ICD-10-CM

## 2021-12-16 DIAGNOSIS — R4189 Other symptoms and signs involving cognitive functions and awareness: Secondary | ICD-10-CM

## 2021-12-16 DIAGNOSIS — R41842 Visuospatial deficit: Secondary | ICD-10-CM

## 2021-12-16 MED ORDER — GADOBENATE DIMEGLUMINE 529 MG/ML IV SOLN
15.0000 mL | Freq: Once | INTRAVENOUS | Status: AC | PRN
  Administered 2021-12-16: 15 mL via INTRAVENOUS

## 2021-12-27 ENCOUNTER — Other Ambulatory Visit: Payer: Self-pay | Admitting: Nurse Practitioner

## 2021-12-27 NOTE — Telephone Encounter (Signed)
Please advise 

## 2021-12-28 ENCOUNTER — Encounter: Payer: Self-pay | Admitting: Nurse Practitioner

## 2021-12-29 NOTE — Telephone Encounter (Signed)
Spoke with pharmacy they are refilling med called pt advised medication is being filled

## 2022-01-19 ENCOUNTER — Other Ambulatory Visit: Payer: Self-pay | Admitting: Nurse Practitioner

## 2022-01-19 MED ORDER — ATORVASTATIN CALCIUM 40 MG PO TABS: ORAL_TABLET | ORAL | 1 refills | Status: DC

## 2022-01-20 DIAGNOSIS — H43812 Vitreous degeneration, left eye: Secondary | ICD-10-CM | POA: Diagnosis not present

## 2022-01-20 DIAGNOSIS — H2513 Age-related nuclear cataract, bilateral: Secondary | ICD-10-CM | POA: Diagnosis not present

## 2022-01-20 DIAGNOSIS — H524 Presbyopia: Secondary | ICD-10-CM | POA: Diagnosis not present

## 2022-01-20 DIAGNOSIS — H11042 Peripheral pterygium, stationary, left eye: Secondary | ICD-10-CM | POA: Diagnosis not present

## 2022-03-20 ENCOUNTER — Ambulatory Visit (INDEPENDENT_AMBULATORY_CARE_PROVIDER_SITE_OTHER): Payer: Medicare HMO

## 2022-03-20 VITALS — BP 124/69 | HR 95 | Ht 62.0 in | Wt 170.1 lb

## 2022-03-20 DIAGNOSIS — Z0000 Encounter for general adult medical examination without abnormal findings: Secondary | ICD-10-CM

## 2022-03-20 NOTE — Progress Notes (Signed)
Subjective:   Emma Roberts is a 66 y.o. female who presents for Medicare Annual (Subsequent) preventive examination.  Review of Systems     Emma Roberts , Thank you for taking time to come for your Medicare Wellness Visit. I appreciate your ongoing commitment to your health goals. Please review the following plan we discussed and let me know if I can assist you in the future.   These are the goals we discussed:  Goals      Patient Stated     Would like to continue being able to do things on her own        This is a list of the screening recommended for you and due dates:  Health Maintenance  Topic Date Due   Zoster (Shingles) Vaccine (1 of 2) Never done   Flu Shot  02/21/2022   Mammogram  12/10/2023   Colon Cancer Screening  10/12/2029   Tetanus Vaccine  06/12/2030   Pneumonia Vaccine  Completed   DEXA scan (bone density measurement)  Completed   Hepatitis C Screening: USPSTF Recommendation to screen - Ages 82-79 yo.  Completed   HPV Vaccine  Aged Out   COVID-19 Vaccine  Discontinued          Objective:    There were no vitals filed for this visit. There is no height or weight on file to calculate BMI.     03/01/2021    3:56 PM  Advanced Directives  Does Patient Have a Medical Advance Directive? No  Would patient like information on creating a medical advance directive? Yes (MAU/Ambulatory/Procedural Areas - Information given)    Current Medications (verified) Outpatient Encounter Medications as of 03/20/2022  Medication Sig   atorvastatin (LIPITOR) 40 MG tablet TAKE 1 TABLET(40 MG) BY MOUTH DAILY   esomeprazole (NEXIUM) 40 MG capsule TAKE 1 CAPSULE BY MOUTH EVERY DAY AT 12 NOON.   FLUoxetine (PROZAC) 20 MG capsule TAKE 1 CAPSULE(20 MG) BY MOUTH DAILY   fluticasone (FLONASE) 50 MCG/ACT nasal spray Place 2 sprays into both nostrils daily.   ibuprofen (ADVIL) 600 MG tablet Take 1 tablet (600 mg total) by mouth every 8 (eight) hours as needed for headache, mild pain  or moderate pain.   Misc Natural Products (NEURIVA PO) Take 2 each by mouth.   Multiple Vitamin (MULTIVITAMIN PO) Take 2 each by mouth.   Multiple Vitamins-Minerals (ALGAE BASED CALCIUM PO) Take 2 each by mouth daily. Algae Calcium Plus   Multiple Vitamins-Minerals (ALGAE BASED CALCIUM) TABS Take 2 each by mouth at bedtime. Algae Cal Strontium   No facility-administered encounter medications on file as of 03/20/2022.    Allergies (verified) Sulfamethoxazole-trimethoprim and Macrobid [nitrofurantoin]   History: Past Medical History:  Diagnosis Date   Cancer ([place])    breast cancer in the past   COVID-19 12/29/2020   GERD (gastroesophageal reflux disease)    High cholesterol    Past Surgical History:  Procedure Laterality Date   BREAST SURGERY     mastectomy and reconstruction using belly fat    MASTECTOMY Right    Family History  Problem Relation Age of Onset   Breast cancer Mother    Stroke Other    Heart attack Other    Alzheimer's disease Neg Hx    Dementia Neg Hx    Social History   Socioeconomic History   Marital status: Single    Spouse name: Not on file   Number of children: Not on file   Years of education:  Not on file   Highest education level: Some college, no degree  Occupational History   Not on file  Tobacco Use   Smoking status: Never   Smokeless tobacco: Never  Vaping Use   Vaping Use: Never used  Substance and Sexual Activity   Alcohol use: Not Currently    Comment: very rarely    Drug use: Never   Sexual activity: Not on file  Other Topics Concern   Not on file  Social History Narrative   Lives with her daughter   Left handed   Caffeine: 0   Social Determinants of Health   Financial Resource Strain: Low Risk  (03/01/2021)   Overall Financial Resource Strain (CARDIA)    Difficulty of Paying Living Expenses: Not hard at all  Food Insecurity: No Food Insecurity (03/01/2021)   Hunger Vital Sign    Worried About Running Out of Food in the Last  Year: Never true    Spring in the Last Year: Never true  Transportation Needs: No Transportation Needs (03/01/2021)   PRAPARE - Hydrologist (Medical): No    Lack of Transportation (Non-Medical): No  Physical Activity: Sufficiently Active (03/01/2021)   Exercise Vital Sign    Days of Exercise per Week: 5 days    Minutes of Exercise per Session: 50 min  Stress: No Stress Concern Present (03/01/2021)   [place]    Feeling of Stress : Not at all  Social Connections: Socially Isolated (03/01/2021)   Social Connection and Isolation Panel [NHANES]    Frequency of Communication with Friends and Family: More than three times a week    Frequency of Social Gatherings with Friends and Family: More than three times a week    Attends Religious Services: Never    Marine scientist or Organizations: No    Attends Archivist Meetings: Never    Marital Status: Divorced    Tobacco Counseling Counseling given: Not Answered   Clinical Intake:   How often do you need to have someone help you when you read instructions, pamphlets, or other written materials from your doctor or pharmacy?: (P) 1 - Never  Diabetic?no         Activities of Daily Living    03/17/2022    9:37 AM  In your present state of health, do you have any difficulty performing the following activities:  Hearing? 0  Vision? 0  Difficulty concentrating or making decisions? 0  Walking or climbing stairs? 0  Dressing or bathing? 0  Doing errands, shopping? 0  Preparing Food and eating ? N  Using the Toilet? N  In the past six months, have you accidently leaked urine? N  Do you have problems with loss of bowel control? N  Managing your Medications? N  Managing your Finances? N  Housekeeping or managing your Housekeeping? N    Patient Care Team: Renee Rival, FNP as PCP - General (Nurse  Practitioner)  Indicate any recent Medical Services you may have received from other than [facility] providers in the past year (date may be approximate).     Assessment:   This is a routine wellness examination for [place].  Hearing/Vision screen No results found.  Dietary issues and exercise activities discussed:     Goals Addressed   None   Depression Screen    11/23/2021   11:22 AM 05/11/2021    2:40 PM 04/21/2021  8:50 AM 03/01/2021    3:57 PM 03/01/2021    3:54 PM 02/25/2021    8:31 AM 12/29/2020    3:16 PM  PHQ 2/9 Scores  PHQ - 2 Score 0 0 1 0 0 1 0    Fall Risk    03/17/2022    9:37 AM 11/23/2021   11:22 AM 05/11/2021    2:40 PM 04/21/2021    8:50 AM 03/01/2021    3:57 PM  Fall Risk   Falls in the past year? 0 0 0 0 0  Number falls in past yr: 0 0 0 0 0  Injury with Fall? 0 0 0 0 0  Risk for fall due to :  No Fall Risks No Fall Risks No Fall Risks No Fall Risks  Follow up  Falls evaluation completed Falls evaluation completed Falls evaluation completed Falls evaluation completed    [place]:  Any stairs in or around the home? No  If so, are there any without handrails? No  Home free of loose throw rugs in walkways, pet beds, electrical cords, etc? Yes  Adequate lighting in your home to reduce risk of falls? Yes   ASSISTIVE DEVICES UTILIZED TO PREVENT FALLS:  Life alert? No  Use of a cane, walker or w/c? No  Grab bars in the bathroom? No  Shower chair or bench in shower? No  Elevated toilet seat or a handicapped toilet? No     Cognitive Function:    12/06/2021   10:54 AM 03/01/2021    3:57 PM 12/09/2020   11:28 AM 10/04/2020    4:43 PM  MMSE - Mini Mental State Exam  Not completed:  Unable to complete    Orientation to time '4  4 4  '$ Orientation to Place '3  4 3  '$ Registration '3  3 3  '$ Attention/ Calculation '3  3 5  '$ Recall 0  0 1  Language- name 2 objects '2  2 2  '$ Language- repeat '1  1 1  '$ Language- follow 3 step command '3  3 3   '$ Language- read & follow direction '1  1 1  '$ Write a sentence '1  1 1  '$ Copy design '1  1 1  '$ Total score '22  23 25        '$ 03/01/2021    3:58 PM  6CIT Screen  What Year? 0 points  What month? 0 points  What time? 0 points  Count back from 20 0 points  Months in reverse 0 points  Repeat phrase 0 points  Total Score 0 points    Immunizations Immunization History  Administered Date(s) Administered   Fluad Quad(high Dose 65+) 04/21/2021   Moderna SARS-COV2 Booster Vaccination 06/29/2020   PNEUMOCOCCAL CONJUGATE-20 04/21/2021   Tdap 06/12/2020    TDAP status: Up to date  Flu Vaccine status: Due, Education has been provided regarding the importance of this vaccine. Advised may receive this vaccine at local pharmacy or Health Dept. Aware to provide a copy of the vaccination record if obtained from local pharmacy or Health Dept. Verbalized acceptance and understanding.  Pneumococcal vaccine status: Up to date  Covid-19 vaccine status: Completed vaccines  Qualifies for Shingles Vaccine? Yes   Zostavax completed No   Shingrix Completed?: No.    Education has been provided regarding the importance of this vaccine. Patient has been advised to call insurance company to determine out of pocket expense if they have not yet received this vaccine. Advised may also  receive vaccine at local pharmacy or Health Dept. Verbalized acceptance and understanding.  Screening Tests Health Maintenance  Topic Date Due   Zoster Vaccines- Shingrix (1 of 2) Never done   INFLUENZA VACCINE  02/21/2022   MAMMOGRAM  12/10/2023   COLONOSCOPY (Pts 45-65yr Insurance coverage will need to be confirmed)  10/12/2029   TETANUS/TDAP  06/12/2030   Pneumonia Vaccine 68 Years old  Completed   DEXA SCAN  Completed   Hepatitis C Screening  Completed   HPV VACCINES  Aged Out   COVID-19 Vaccine  Discontinued    Health Maintenance  Health Maintenance Due  Topic Date Due   Zoster Vaccines- Shingrix (1 of 2) Never  done   INFLUENZA VACCINE  02/21/2022    Colorectal cancer screening: Type of screening: Colonoscopy. Completed 10/13/19. Repeat every 10 years  Mammogram status: Completed 12/09/21. Repeat every year  Bone Density status: Completed 04/12/2020. Results reflect: Bone density results: OSTEOPOROSIS. Repeat every 2 years.  Lung Cancer Screening: (Low Dose CT Chest recommended if Age 68-80years, 30 pack-year currently smoking OR have quit w/in 15years.) does not qualify.   Lung Cancer Screening Referral: no  Additional Screening:  Hepatitis C Screening: does qualify; Completed 10/13/20  Vision Screening: Recommended annual ophthalmology exams for early detection of glaucoma and other disorders of the eye. Is the patient up to date with their annual eye exam?  No  Who is the provider or what is the name of the office in which the patient attends annual eye exams? [place]  If pt is not established with a provider, would they like to be referred to a provider to establish care? No .   Dental Screening: Recommended annual dental exams for proper oral hygiene  Community Resource Referral / Chronic Care Management: CRR required this visit?  No   CCM required this visit?  No      Plan:     I have personally reviewed and noted the following in the patient's chart:   Medical and social history Use of alcohol, tobacco or illicit drugs  Current medications and supplements including opioid prescriptions. Patient is not currently taking opioid prescriptions. Functional ability and status Nutritional status Physical activity Advanced directives List of other physicians Hospitalizations, surgeries, and ER visits in previous 12 months Vitals Screenings to include cognitive, depression, and falls Referrals and appointments  In addition, I have reviewed and discussed with patient certain preventive protocols, quality metrics, and best practice recommendations. A written personalized care  plan for preventive services as well as general preventive health recommendations were provided to patient.     KQuentin Angst [place]  03/20/2022

## 2022-03-20 NOTE — Patient Instructions (Signed)
  Emma Roberts , Thank you for taking time to come for your Medicare Wellness Visit. I appreciate your ongoing commitment to your health goals. Please review the following plan we discussed and let me know if I can assist you in the future.   These are the goals we discussed:  Goals      Patient Stated     Would like to continue being able to do things on her own     Patient Stated     Lose weight.        This is a list of the screening recommended for you and due dates:  Health Maintenance  Topic Date Due   Zoster (Shingles) Vaccine (1 of 2) Never done   Flu Shot  02/21/2022   Mammogram  12/10/2023   Colon Cancer Screening  10/12/2029   Tetanus Vaccine  06/12/2030   Pneumonia Vaccine  Completed   DEXA scan (bone density measurement)  Completed   Hepatitis C Screening: USPSTF Recommendation to screen - Ages 44-79 yo.  Completed   HPV Vaccine  Aged Out   COVID-19 Vaccine  Discontinued

## 2022-03-24 ENCOUNTER — Encounter: Payer: Self-pay | Admitting: Internal Medicine

## 2022-03-24 ENCOUNTER — Ambulatory Visit (INDEPENDENT_AMBULATORY_CARE_PROVIDER_SITE_OTHER): Payer: Medicare HMO | Admitting: Internal Medicine

## 2022-03-24 VITALS — BP 112/70 | HR 74 | Ht 62.5 in | Wt 170.4 lb

## 2022-03-24 DIAGNOSIS — F339 Major depressive disorder, recurrent, unspecified: Secondary | ICD-10-CM | POA: Diagnosis not present

## 2022-03-24 DIAGNOSIS — R413 Other amnesia: Secondary | ICD-10-CM

## 2022-03-24 DIAGNOSIS — R69 Illness, unspecified: Secondary | ICD-10-CM | POA: Diagnosis not present

## 2022-03-24 MED ORDER — FLUOXETINE HCL 40 MG PO CAPS: 40.0000 mg | ORAL_CAPSULE | Freq: Every day | ORAL | 3 refills | Status: DC

## 2022-03-24 NOTE — Assessment & Plan Note (Signed)
She was started on fluoxetine in March 2022.  Currently prescribed 20 mg daily.  As noted above, I wonder if undertreated depression is contributing to her memory loss. -Increase Prozac to 40 mg daily today

## 2022-03-24 NOTE — Progress Notes (Signed)
Acute Office Visit  Subjective:     Patient ID: Emma Roberts, female    DOB: 03/02/1956, 66 y.o.   MRN: [id number]  Chief Complaint  Patient presents with   medication review   Emma Roberts is a 66 year old woman presenting today for an acute visit.  She is concerned about memory issues and if her medications are contributing to her current symptoms.  Emma Roberts states that for the past year she has had difficulty with short-term memory.  She will frequently go to the grocery store and forget what she is there to buy.  She will also drive to other destinations and forget what she is doing there upon arrival.  She is frustrated about this and concerned that she has underlying dementia, noting that she is "too young" to have these issues.  On review of prior documentation, Emma Roberts expressed similar concerns earlier this year and was referred to neurology for neurocognitive evaluation.  She underwent extensive lab work, imaging and EEG, all of which were within normal limits.  She states that her memory issues are not getting worse, but are also not improving.  Of note, Emma Roberts also has a past medical history significant for severe depression and prior suicide attempt in 2021.  She recently returned to [facility] after living in [place], [place] with a partner.  Her moved to this area was related to a difficult break-up.  She describes being very social and active prior to moving back to [place].  Now she states that she mostly stays home, talks to herself, and does not participate in any social activities aside from watching her grandchildren.  She is prescribed Prozac 20 mg daily.  Denies SI/HI today.  Review of Systems  Psychiatric/Behavioral:  Positive for memory loss.   All other systems reviewed and are negative.     Objective:    BP 112/70   Pulse 74   Ht 5' 2.5" (1.588 m)   Wt 170 lb 6.4 oz (77.3 kg)   SpO2 97%   BMI 30.67 kg/m    Physical Exam Vitals reviewed.  Constitutional:       General: She is not in acute distress.    Appearance: Normal appearance. She is not toxic-appearing.  HENT:     Head: Normocephalic and atraumatic.     Right Ear: External ear normal.     Left Ear: External ear normal.     Nose: Nose normal. No congestion.     Mouth/Throat:     Mouth: Mucous membranes are moist.     Pharynx: Oropharynx is clear. No oropharyngeal exudate.  Eyes:     General: No scleral icterus.    Extraocular Movements: Extraocular movements intact.     Conjunctiva/sclera: Conjunctivae normal.     Pupils: Pupils are equal, round, and reactive to light.  Cardiovascular:     Rate and Rhythm: Normal rate and regular rhythm.     Pulses: Normal pulses.     Heart sounds: Normal heart sounds. No murmur heard.    No friction rub. No gallop.  Pulmonary:     Effort: Pulmonary effort is normal.     Breath sounds: Normal breath sounds. No wheezing, rhonchi or rales.  Abdominal:     General: Abdomen is flat. Bowel sounds are normal. There is no distension.     Palpations: Abdomen is soft.     Tenderness: There is no abdominal tenderness.  Musculoskeletal:        General: No swelling.  Cervical back: Normal range of motion.     Right lower leg: No edema.     Left lower leg: No edema.  Lymphadenopathy:     Cervical: No cervical adenopathy.  Skin:    General: Skin is warm and dry.     Capillary Refill: Capillary refill takes less than 2 seconds.     Coloration: Skin is not jaundiced.  Neurological:     General: No focal deficit present.     Mental Status: She is alert and oriented to person, place, and time.     Cranial Nerves: No cranial nerve deficit.     Sensory: No sensory deficit.     Motor: No weakness.     Coordination: Coordination normal.     Gait: Gait normal.  Psychiatric:        Mood and Affect: Mood normal.        Behavior: Behavior normal.        Thought Content: Thought content normal.       Assessment & Plan:   Problem List Items Addressed  This Visit       Other   Memory loss    Presents today to discuss ongoing issues of memory loss.  She reports multiple weekly issues with forgetting where she is going or what she is intending to do.  Emma Roberts lives alone and is able to perform all ADLs independently.  She also continues to cook, clean, and pay her own bills.  Emma Roberts underwent extensive testing with neurology previously that did not demonstrate any metabolic or structural causes to explain her memory loss.  I do wonder if her history of severe depression and the circumstances surrounding her moved back to [place] are contributing to her memory loss.  Her PHQ 2 score is 0, however she does describe loss of appetite, lack of interest in activities she used to enjoy, and feeling depressed about her current situation. -She has follow-up scheduled with neurology on 9/20 -I have increased Prozac to 40 mg daily today      Depression, recurrent ([place]) - Primary    She was started on fluoxetine in March 2022.  Currently prescribed 20 mg daily.  As noted above, I wonder if undertreated depression is contributing to her memory loss. -Increase Prozac to 40 mg daily today      Relevant Medications   FLUoxetine (PROZAC) 40 MG capsule    Meds ordered this encounter  Medications   FLUoxetine (PROZAC) 40 MG capsule    Sig: Take 1 capsule (40 mg total) by mouth daily.    Dispense:  90 capsule    Refill:  3    Return in about 2 months (around 05/30/2022).  Johnette Abraham, MD

## 2022-03-24 NOTE — Assessment & Plan Note (Signed)
Presents today to discuss ongoing issues of memory loss.  She reports multiple weekly issues with forgetting where she is going or what she is intending to do.  Emma Roberts lives alone and is able to perform all ADLs independently.  She also continues to cook, clean, and pay her own bills.  Emma Roberts underwent extensive testing with neurology previously that did not demonstrate any metabolic or structural causes to explain her memory loss.  I do wonder if her history of severe depression and the circumstances surrounding her moved back to [place] are contributing to her memory loss.  Her PHQ 2 score is 0, however she does describe loss of appetite, lack of interest in activities she used to enjoy, and feeling depressed about her current situation. -She has follow-up scheduled with neurology on 9/20 -I have increased Prozac to 40 mg daily today

## 2022-03-24 NOTE — Patient Instructions (Signed)
It was a pleasure to see you today.  Thank you for giving [place] the opportunity to be involved in your care.  Below is a brief recap of your visit and next steps.  We will plan to see you again in 05/30/22  Summary We have increased fluoxetine (Prozac) to 40 mg daily  Next steps We fill plan for follow up in November. Please let me know if you need me between now and then. You have a neurology appointment on 9/20

## 2022-04-11 ENCOUNTER — Telehealth: Payer: Self-pay | Admitting: Neurology

## 2022-04-11 NOTE — Telephone Encounter (Signed)
Called to r/s 04/12/22 appt (MD out), pt will cb to reschedule. Per Dr. Jaynee Eagles, pt can be rescheduled with Amy or Megan.

## 2022-04-12 ENCOUNTER — Ambulatory Visit: Payer: Medicaid Other | Admitting: Neurology

## 2022-04-17 ENCOUNTER — Other Ambulatory Visit: Payer: Self-pay | Admitting: Nurse Practitioner

## 2022-04-18 ENCOUNTER — Ambulatory Visit (INDEPENDENT_AMBULATORY_CARE_PROVIDER_SITE_OTHER): Payer: Self-pay | Admitting: Neurology

## 2022-04-18 DIAGNOSIS — R4189 Other symptoms and signs involving cognitive functions and awareness: Secondary | ICD-10-CM

## 2022-04-18 NOTE — Progress Notes (Signed)
  [facility]    Provider:  Dr Jaynee Eagles Requesting Provider: Noreene Larsson, NP Primary Care Provider:  Renee Rival, FNP  CC:  Memory loss  EEG was normal. MRI: IMPRESSION: Unremarkable MRI scan of the brain with and without contrast.   [facility] Patient did not get her sleep evaluation or her formal memory testing so we canceled appointment today and I helped her get it sleep eval set up(we called her, didn't get through), we scheduled her sleep eval for [facility] for next month and I gave her the number to call to schedule fer formal neuropsychiatric testing (I called first and they did have her consult, left messages, patient did not get). We will reschedule her follow up, gave her money back, when she has completed workup.

## 2022-05-02 DIAGNOSIS — H5213 Myopia, bilateral: Secondary | ICD-10-CM | POA: Diagnosis not present

## 2022-05-02 DIAGNOSIS — Z0100 Encounter for examination of eyes and vision without abnormal findings: Secondary | ICD-10-CM | POA: Diagnosis not present

## 2022-05-05 ENCOUNTER — Encounter: Payer: Self-pay | Admitting: Internal Medicine

## 2022-05-05 ENCOUNTER — Ambulatory Visit (INDEPENDENT_AMBULATORY_CARE_PROVIDER_SITE_OTHER): Payer: Medicare HMO | Admitting: Internal Medicine

## 2022-05-05 VITALS — BP 130/78 | HR 82 | Ht 62.0 in | Wt 174.4 lb

## 2022-05-05 DIAGNOSIS — H6691 Otitis media, unspecified, right ear: Secondary | ICD-10-CM | POA: Diagnosis not present

## 2022-05-05 DIAGNOSIS — Z23 Encounter for immunization: Secondary | ICD-10-CM | POA: Diagnosis not present

## 2022-05-05 DIAGNOSIS — H66001 Acute suppurative otitis media without spontaneous rupture of ear drum, right ear: Secondary | ICD-10-CM | POA: Diagnosis not present

## 2022-05-05 HISTORY — DX: Otitis media, unspecified, right ear: H66.91

## 2022-05-05 MED ORDER — AMOXICILLIN-POT CLAVULANATE 875-125 MG PO TABS: 1.0000 | ORAL_TABLET | Freq: Two times a day (BID) | ORAL | 0 refills | Status: AC

## 2022-05-05 NOTE — Patient Instructions (Signed)
You have an ear infection. I have prescribed Augmentin. Take one tablet two times daily for the next 7 days.

## 2022-05-05 NOTE — Assessment & Plan Note (Addendum)
Presenting today for evaluation of right ear pain.  She reports onset of pain 3 days ago.  She is unable to sleep on her right side due to pain.  Denies fever/chills or upper respiratory symptoms.  The TM is erythematous on exam.  There is also tenderness palpation when maneuvering the right ear. -Augmentin 875-125 mg twice daily x7 days prescribed

## 2022-05-05 NOTE — Progress Notes (Signed)
   Acute Office Visit  Subjective:     Patient ID: Emma Roberts, female    DOB: [birth year], 66 y.o.   MRN: [id number]  Chief Complaint  Patient presents with   Otitis Media    Right ear, started 05/03/2022   Emma Roberts presents today for an acute visit for right ear pain.  She reports onset of right ear discomfort 3 days ago.  She has not been able to sleep on her right side due to pain.  She has not noted any drainage out of her right ear.  Denies fever/chills.  She has not had any upper respiratory symptoms of congestion runny nose or cough.  She has not taken any medication for pain relief.  Review of Systems  HENT:  Positive for ear pain (Right ear pain).   All other systems reviewed and are negative.     Objective:    BP 130/78   Pulse 82   Ht '5\' 2"'$  (1.575 m)   Wt 174 lb 6.4 oz (79.1 kg)   SpO2 95%   BMI 31.90 kg/m   Physical Exam Vitals reviewed.  HENT:     Right Ear: Hearing and external ear normal. Tenderness present. Tympanic membrane is erythematous.       Assessment & Plan:   Problem List Items Addressed This Visit       Nervous and Auditory   Acute otitis media, right    Presenting today for evaluation of right ear pain.  She reports onset of pain 3 days ago.  She is unable to sleep on her right side due to pain.  Denies fever/chills or upper respiratory symptoms.  The TM is erythematous on exam.  There is also tenderness palpation when maneuvering the right ear. -Augmentin 875-125 mg twice daily x7 days prescribed      Relevant Medications   amoxicillin-clavulanate (AUGMENTIN) 875-125 MG tablet   Meds ordered this encounter  Medications   amoxicillin-clavulanate (AUGMENTIN) 875-125 MG tablet    Sig: Take 1 tablet by mouth 2 (two) times daily for 7 days.    Dispense:  14 tablet    Refill:  0    Return if symptoms worsen or fail to improve.  Johnette Abraham, MD

## 2022-05-09 ENCOUNTER — Other Ambulatory Visit: Payer: Self-pay | Admitting: Nurse Practitioner

## 2022-05-15 ENCOUNTER — Telehealth: Payer: Self-pay | Admitting: Internal Medicine

## 2022-05-15 ENCOUNTER — Encounter: Payer: Self-pay | Admitting: Internal Medicine

## 2022-05-15 ENCOUNTER — Institutional Professional Consult (permissible substitution): Payer: Medicaid Other | Admitting: Neurology

## 2022-05-15 MED ORDER — FLUCONAZOLE 150 MG PO TABS: 150.0000 mg | ORAL_TABLET | Freq: Once | ORAL | 0 refills | Status: AC

## 2022-05-15 NOTE — Telephone Encounter (Signed)
Diflucan 150 mg x 1 ordered. Rx sent to [facility] in [place].

## 2022-05-15 NOTE — Addendum Note (Signed)
Addended by: Marland Kitchen E on: 05/15/2022 01:58 PM   Modules accepted: Orders

## 2022-05-15 NOTE — Telephone Encounter (Signed)
Pt called stating that abx she was given for her ear infection has called a yeast infection. Wants to know if she can please get something for this?  [facility] [address]

## 2022-05-29 ENCOUNTER — Encounter: Payer: Medicare HMO | Admitting: Nurse Practitioner

## 2022-05-30 ENCOUNTER — Encounter: Payer: Medicare HMO | Admitting: Internal Medicine

## 2022-05-30 ENCOUNTER — Encounter: Payer: Medicare HMO | Admitting: Nurse Practitioner

## 2022-06-05 IMAGING — MG DIGITAL SCREENING UNILAT LEFT W/ TOMO W/ CAD
5 series · 6 of 17 positions shown · non-contrast
Comparison: Previous exam(s).

CLINICAL DATA: Screening.

EXAM:
DIGITAL SCREENING UNILATERAL LEFT MAMMOGRAM WITH CAD AND
TOMOSYNTHESIS
TECHNIQUE: Left screening digital craniocaudal and mediolateral oblique
mammograms were obtained. Left screening digital breast
tomosynthesis was performed. The images were evaluated with
computer-aided detection.

[L MLO synth-2D]
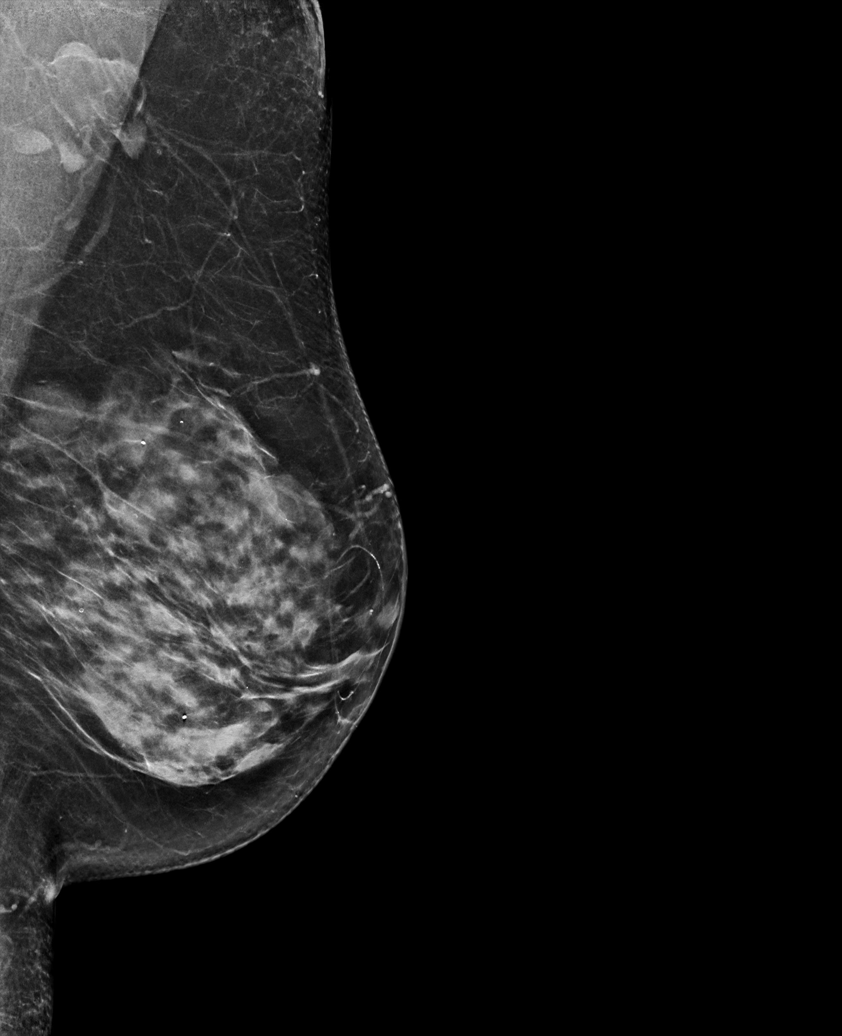

[L CC synth-2D]
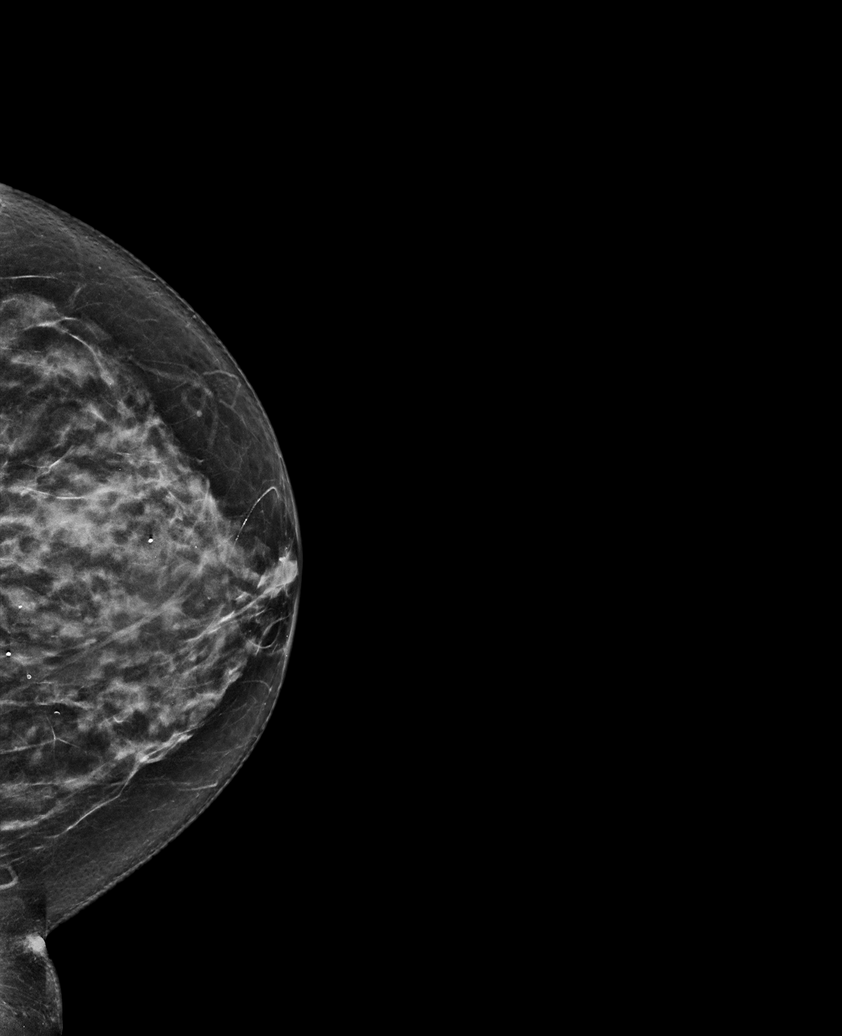

[L MLO tomo · 2 of 75 frames shown]
[frame 25/75]
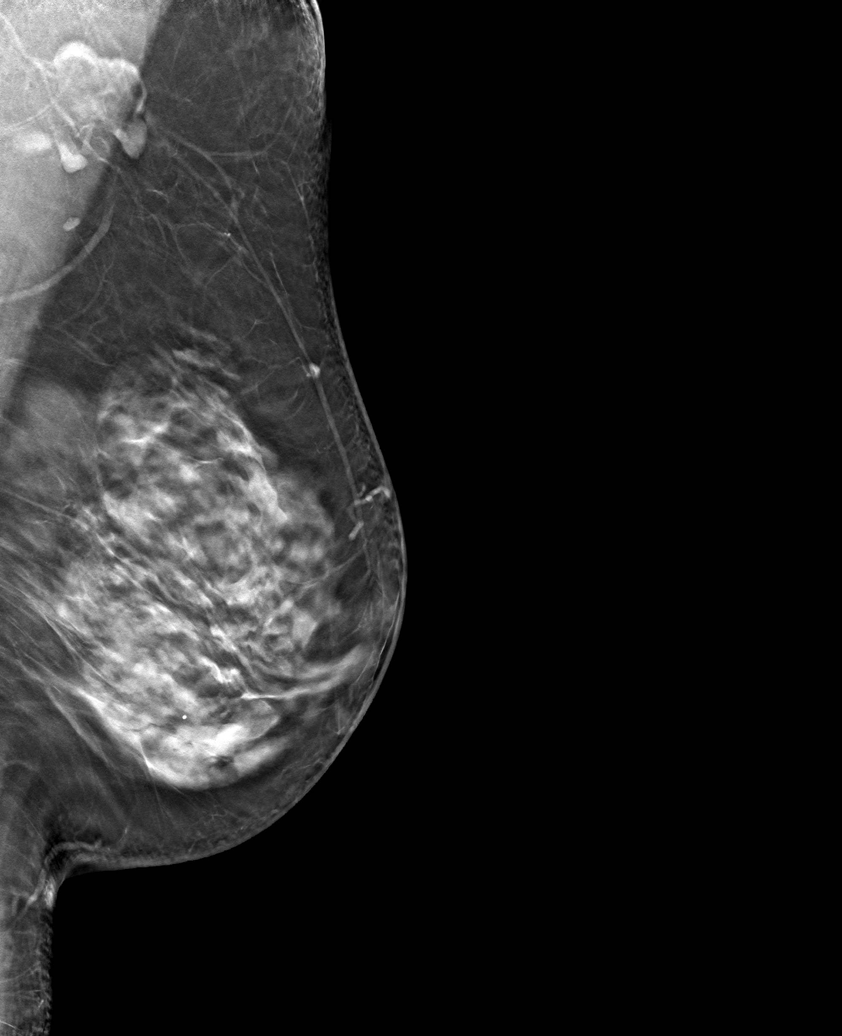
[frame 38/75]
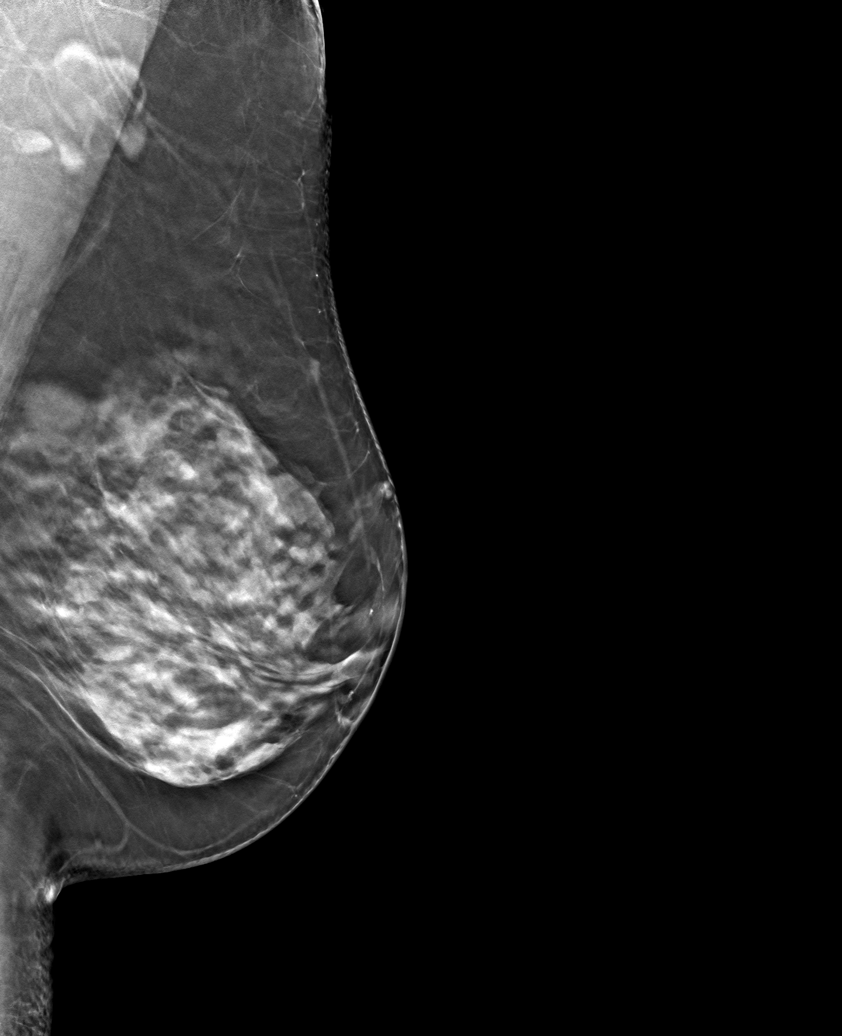

[L CC tomo (1 of 2) · tomo slice 45/89.0]
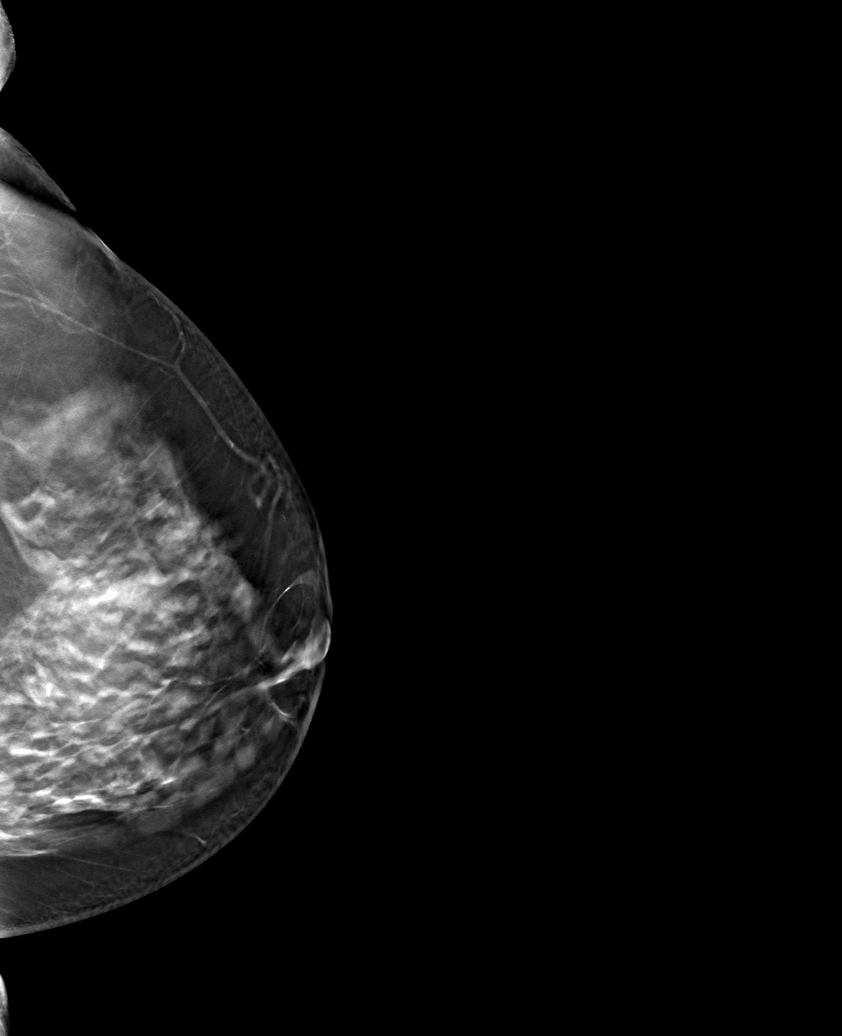

[L CC tomo (2 of 2) · tomo slice 35/68.0]
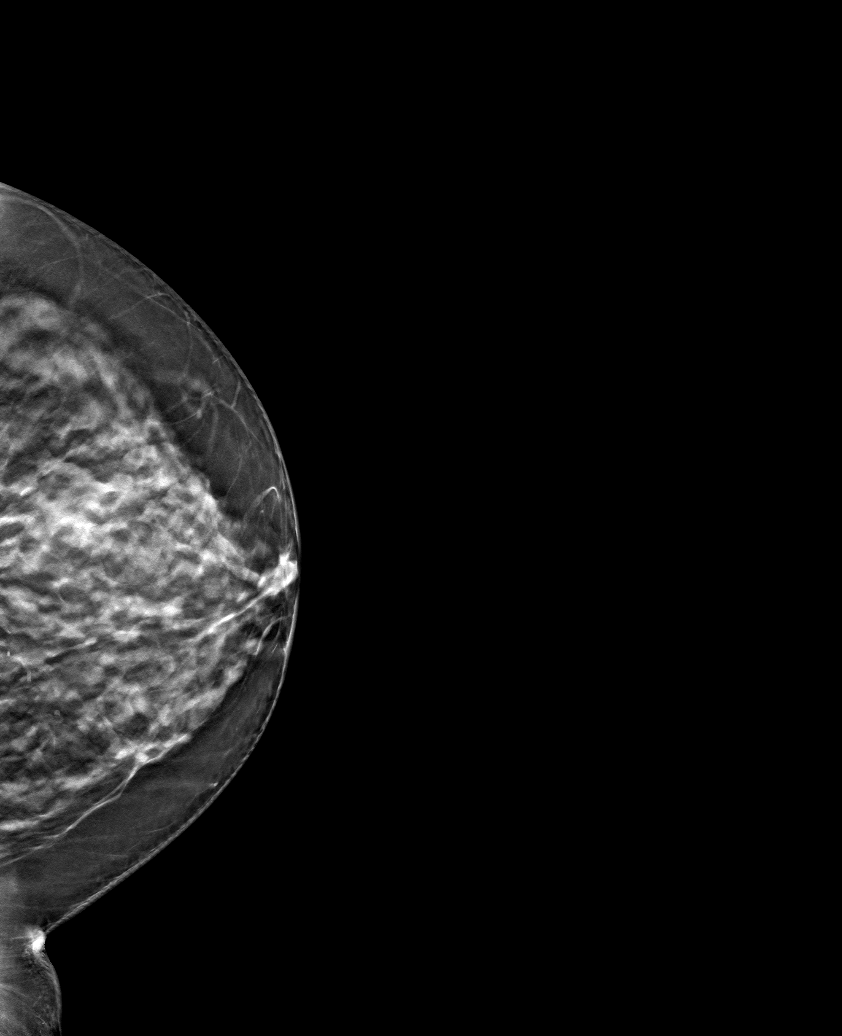

[6 of 17 positions shown; findings below may reference images not displayed]

ACR Breast Density Category c: The breast tissue is heterogeneously
dense, which may obscure small masses.
FINDINGS: The patient has had a right mastectomy. There are no findings
suspicious for malignancy.
IMPRESSION: No mammographic evidence of malignancy. A result letter of this
screening mammogram will be mailed directly to the patient.

RECOMMENDATION:
Screening mammogram in one year.  (Code:[id number]-[id number])

BI-RADS CATEGORY  1: Negative.

## 2022-06-23 ENCOUNTER — Ambulatory Visit (INDEPENDENT_AMBULATORY_CARE_PROVIDER_SITE_OTHER): Payer: Medicare HMO | Admitting: Internal Medicine

## 2022-06-23 ENCOUNTER — Encounter: Payer: Self-pay | Admitting: Internal Medicine

## 2022-06-23 VITALS — BP 107/71 | HR 79 | Ht 62.0 in | Wt 169.4 lb

## 2022-06-23 DIAGNOSIS — Z139 Encounter for screening, unspecified: Secondary | ICD-10-CM | POA: Diagnosis not present

## 2022-06-23 DIAGNOSIS — E785 Hyperlipidemia, unspecified: Secondary | ICD-10-CM | POA: Diagnosis not present

## 2022-06-23 DIAGNOSIS — Z0001 Encounter for general adult medical examination with abnormal findings: Secondary | ICD-10-CM | POA: Diagnosis not present

## 2022-06-23 NOTE — Progress Notes (Signed)
Complete physical exam  Patient: Emma Roberts   DOB: 1956-05-18   66 y.o. Female  MRN: [id number]  Subjective:    Chief Complaint  Patient presents with   Annual Exam    Mahira Petras is a 66 y.o. female who presents today for a complete physical exam. She reports consuming a general diet. Home exercise routine includes walking 1 hrs per day. She generally feels fairly well. She reports sleeping well. She does not have additional problems to discuss today.   Most recent fall risk assessment:    06/23/2022    2:20 PM  [place] in the past year? 0  Number falls in past yr: 0  Injury with Fall? 0  Risk for fall due to : No Fall Risks  Follow up Falls evaluation completed    Most recent depression screenings:    06/23/2022    2:20 PM 05/05/2022   10:04 AM  PHQ 2/9 Scores  PHQ - 2 Score 3 0  PHQ- 9 Score 8     Vision:Within last year and Dental: No current dental problems and Receives regular dental care  Past Medical History:  Diagnosis Date   Cancer ([place])    breast cancer in the past   COVID-19 12/29/2020   GERD (gastroesophageal reflux disease)    High cholesterol    Past Surgical History:  Procedure Laterality Date   BREAST SURGERY     mastectomy and reconstruction using belly fat    MASTECTOMY Right    Social History   Tobacco Use   Smoking status: Never   Smokeless tobacco: Never  Vaping Use   Vaping Use: Never used  Substance Use Topics   Alcohol use: Not Currently    Comment: very rarely    Drug use: Never   Family History  Problem Relation Age of Onset   Breast cancer Mother    Stroke Other    Heart attack Other    Alzheimer's disease Neg Hx    Dementia Neg Hx    Allergies  Allergen Reactions   Sulfamethoxazole-Trimethoprim Anaphylaxis    Other reaction(s): Other (See Comments) Other Reaction: swelling mouth and tongue    Macrobid [Nitrofurantoin] Hives and Itching    Pt broke out in rash and hives after taking    Patient Care  Team: Johnette Abraham, MD as PCP - General (Internal Medicine)   Outpatient Medications Prior to Visit  Medication Sig   atorvastatin (LIPITOR) 40 MG tablet TAKE 1 TABLET(40 MG) BY MOUTH DAILY   esomeprazole (NEXIUM) 40 MG capsule TAKE 1 CAPSULE BY MOUTH EVERY DAY AT NOON   FLUoxetine (PROZAC) 40 MG capsule Take 1 capsule (40 mg total) by mouth daily.   Multiple Vitamin (MULTIVITAMIN PO) Take 2 each by mouth.   Multiple Vitamins-Minerals (ALGAE BASED CALCIUM) TABS Take 2 each by mouth at bedtime. Algae Cal Strontium   No facility-administered medications prior to visit.   Review of Systems  Constitutional:  Negative for chills and fever.  HENT:  Negative for sore throat.   Respiratory:  Negative for cough and shortness of breath.   Cardiovascular:  Negative for chest pain, palpitations and leg swelling.  Gastrointestinal:  Negative for abdominal pain, blood in stool, constipation, diarrhea, nausea and vomiting.  Genitourinary:  Negative for dysuria and hematuria.  Musculoskeletal:  Negative for myalgias.  Skin:  Negative for itching and rash.  Neurological:  Negative for dizziness and headaches.  Psychiatric/Behavioral:  Negative for depression and suicidal ideas.  Objective:     BP 107/71   Pulse 79   Ht _0  (1.575 m)   Wt 169 lb 6.4 oz (76.8 kg)   SpO2 93%   BMI 30.98 kg/m  BP Readings from Last 3 Encounters:  06/23/22 107/71  05/05/22 130/78  03/24/22 112/70   Physical Exam Vitals reviewed.  Constitutional:      General: She is not in acute distress.    Appearance: Normal appearance. She is not toxic-appearing.  HENT:     Head: Normocephalic and atraumatic.     Right Ear: External ear normal.     Left Ear: External ear normal.     Nose: Nose normal. No congestion.     Mouth/Throat:     Mouth: Mucous membranes are moist.     Pharynx: Oropharynx is clear. No oropharyngeal exudate.  Eyes:     General: No scleral icterus.    Extraocular Movements:  Extraocular movements intact.     Conjunctiva/sclera: Conjunctivae normal.     Pupils: Pupils are equal, round, and reactive to light.  Cardiovascular:     Rate and Rhythm: Normal rate and regular rhythm.     Pulses: Normal pulses.     Heart sounds: Normal heart sounds. No murmur heard.    No friction rub. No gallop.  Pulmonary:     Effort: Pulmonary effort is normal.     Breath sounds: Normal breath sounds. No wheezing, rhonchi or rales.  Abdominal:     General: Abdomen is flat. Bowel sounds are normal. There is no distension.     Palpations: Abdomen is soft.     Tenderness: There is no abdominal tenderness.  Musculoskeletal:        General: No swelling.     Cervical back: Normal range of motion.     Right lower leg: No edema.     Left lower leg: No edema.  Lymphadenopathy:     Cervical: No cervical adenopathy.  Skin:    General: Skin is warm and dry.     Capillary Refill: Capillary refill takes less than 2 seconds.     Coloration: Skin is not jaundiced.  Neurological:     General: No focal deficit present.     Mental Status: She is alert and oriented to person, place, and time.     Cranial Nerves: No cranial nerve deficit.     Sensory: No sensory deficit.     Motor: No weakness.     Coordination: Coordination normal.     Gait: Gait normal.  Psychiatric:        Mood and Affect: Mood normal.        Behavior: Behavior normal.        Thought Content: Thought content normal.     Last CBC Lab Results  Component Value Date   WBC 8.0 12/06/2021   HGB 15.4 12/06/2021   HCT 44.3 12/06/2021   MCV 92 12/06/2021   MCH 32.1 12/06/2021   RDW 12.9 12/06/2021   PLT 305 16/04/9603   Last metabolic panel Lab Results  Component Value Date   GLUCOSE 82 12/06/2021   NA 139 12/06/2021   K 4.5 12/06/2021   CL 100 12/06/2021   CO2 24 12/06/2021   BUN 19 12/06/2021   CREATININE 0.96 12/06/2021   EGFR 65 12/06/2021   CALCIUM 10.6 (H) 12/06/2021   PROT 7.2 12/06/2021   ALBUMIN  4.6 12/06/2021   LABGLOB 2.6 12/06/2021   AGRATIO 1.8 12/06/2021   BILITOT 0.6 12/06/2021  ALKPHOS 133 (H) 12/06/2021   AST 32 12/06/2021   ALT 31 12/06/2021   Last lipids Lab Results  Component Value Date   CHOL 181 04/21/2021   HDL 70 04/21/2021   LDLCALC 93 04/21/2021   TRIG 98 04/21/2021   Last thyroid functions Lab Results  Component Value Date   TSH 2.410 12/06/2021   Last vitamin D Lab Results  Component Value Date   VD25OH 37.5 10/13/2020   Last vitamin B12 and Folate Lab Results  Component Value Date   VITAMINB12 908 12/06/2021   FOLATE >20.0 12/06/2021      Assessment & Plan:    Routine Health Maintenance and Physical Exam  Immunization History  Administered Date(s) Administered   Fluad Quad(high Dose 65+) 04/21/2021, 05/05/2022   Moderna SARS-COV2 Booster Vaccination 06/29/2020   PNEUMOCOCCAL CONJUGATE-20 04/21/2021   Tdap 06/12/2020    Health Maintenance  Topic Date Due   Zoster Vaccines- Shingrix (1 of 2) 09/22/2022 (Originally 04/10/2004)   Medicare Annual Wellness (AWV)  03/21/2023   MAMMOGRAM  12/10/2023   COLONOSCOPY (Pts 45-50yr Insurance coverage will need to be confirmed)  10/12/2029   DTaP/Tdap/Td (2 - Td or Tdap) 06/12/2030   Pneumonia Vaccine 68 Years old  Completed   INFLUENZA VACCINE  Completed   DEXA SCAN  Completed   Hepatitis C Screening  Completed   HPV VACCINES  Aged Out   COVID-19 Vaccine  Discontinued    Discussed health benefits of physical activity, and encouraged her to engage in regular exercise appropriate for her age and condition.  Problem List Items Addressed This Visit       Encounter for well adult exam with abnormal findings - Primary    Presenting today for annual exam.  She is asymptomatic and had no concerns to discuss.  Previous records and labs were reviewed. -Repeat labs ordered (A1c, vitamin D, and lipid panel) -I recommended that she receive her Shingrix vaccine at her pharmacy -Additionally HM  items are up-to-date -Follow-up in 3 months      Return in about 3 months (around 09/22/2022).  PJohnette Abraham MD

## 2022-06-23 NOTE — Assessment & Plan Note (Signed)
Presenting today for annual exam.  She is asymptomatic and had no concerns to discuss.  Previous records and labs were reviewed. -Repeat labs ordered (A1c, vitamin D, and lipid panel) -I recommended that she receive her Shingrix vaccine at her pharmacy -Additionally HM items are up-to-date -Follow-up in 3 months

## 2022-06-23 NOTE — Patient Instructions (Signed)
It was a pleasure to see you today.  Thank you for giving [place] the opportunity to be involved in your care.  Below is a brief recap of your visit and next steps.  We will plan to see you again in 3 months.  Summary We completed your annual exam today Repeat labs have been ordered We will plan for follow up in 3 months I recommend you receive your Shingles vaccine at your pharmacy

## 2022-06-24 LAB — LIPID PANEL
Chol/HDL Ratio: 2.5 ratio (ref 0.0–4.4)
Cholesterol, Total: 205 mg/dL — ABNORMAL HIGH (ref 100–199)
HDL: 81 mg/dL (ref >39)
LDL Chol Calc (NIH): 105 mg/dL — ABNORMAL HIGH (ref 0–99)
Triglycerides: 110 mg/dL (ref 0–149)
VLDL Cholesterol Cal: 19 mg/dL (ref 5–40)

## 2022-06-24 LAB — HEMOGLOBIN A1C
Est. average glucose Bld gHb Est-mCnc: 131 mg/dL
Hgb A1c MFr Bld: 6.2 % — ABNORMAL HIGH (ref 4.8–5.6)

## 2022-06-24 LAB — VITAMIN D 25 HYDROXY (VIT D DEFICIENCY, FRACTURES): Vit D, 25-Hydroxy: 46 ng/mL (ref 30.0–100.0)

## 2022-06-26 DIAGNOSIS — F330 Major depressive disorder, recurrent, mild: Secondary | ICD-10-CM | POA: Diagnosis not present

## 2022-06-26 DIAGNOSIS — G3184 Mild cognitive impairment, so stated: Secondary | ICD-10-CM | POA: Diagnosis not present

## 2022-06-26 DIAGNOSIS — F419 Anxiety disorder, unspecified: Secondary | ICD-10-CM | POA: Diagnosis not present

## 2022-08-10 ENCOUNTER — Ambulatory Visit (INDEPENDENT_AMBULATORY_CARE_PROVIDER_SITE_OTHER): Payer: No Typology Code available for payment source | Admitting: Family Medicine

## 2022-08-10 ENCOUNTER — Encounter: Payer: Self-pay | Admitting: Family Medicine

## 2022-08-10 VITALS — BP 120/77 | HR 69 | Ht 62.0 in | Wt 172.0 lb

## 2022-08-10 DIAGNOSIS — N3000 Acute cystitis without hematuria: Secondary | ICD-10-CM

## 2022-08-10 DIAGNOSIS — R300 Dysuria: Secondary | ICD-10-CM | POA: Diagnosis not present

## 2022-08-10 LAB — POCT URINALYSIS DIP (CLINITEK)
Bilirubin, UA: NEGATIVE
Blood, UA: NEGATIVE
Glucose, UA: NEGATIVE mg/dL
Nitrite, UA: NEGATIVE
POC PROTEIN,UA: 30 — AB
Spec Grav, UA: 1.02 (ref 1.010–1.025)
Urobilinogen, UA: 1 E.U./dL
pH, UA: 7 (ref 5.0–8.0)

## 2022-08-10 MED ORDER — AMPICILLIN 500 MG PO CAPS: 500.0000 mg | ORAL_CAPSULE | Freq: Three times a day (TID) | ORAL | 0 refills | Status: DC

## 2022-08-10 NOTE — Patient Instructions (Addendum)
Follow-up with Dr. Doren Custard as before, call if you need to be seen sooner.  You are treated today for presumed urinary tract infection, 3-day course of ampicillin is prescribed.  Ensure that you drink 60 to 64 ounces of water daily and empty your bladder often. Urine also suggest that you are mildly dehydrated , so very important that you drink sufficent water daily  The urine is sent for additional testing and the results will be sent to you via MyChart.   Best for 2024!,  ENJOY getting OLDER!! Thanks for choosing [facility], we consider it a privelige to serve you.

## 2022-08-10 NOTE — Progress Notes (Signed)
   Emma Roberts     MRN: [id number]      DOB: Jun 24, 1956   HPI Emma Roberts is here with a 4-day history of burning with urination.  She also reports mild frequency and thinks she has a bladder infection.  She denies flank pain nausea vomiting or chills.  She has no other concerns  ROS Denies recent fever or chills. Denies sinus pressure, nasal congestion, ear pain or sore throat. Denies chest congestion, productive cough or wheezing. Denies uncontrolled depression, anxiety or insomnia. Denies skin break down or rash.   PE  BP 120/77   Pulse 69   Ht '5\' 2"'$  (1.575 m)   Wt 172 lb (78 kg)   SpO2 94%   BMI 31.46 kg/m   Patient alert and oriented and in no cardiopulmonary distress.  HEENT: No facial asymmetry, EOMI,     Neck supple .  Chest: Clear to auscultation bilaterally.  CVS: S1, S2 no murmurs, no S3.Regular rate.  ABD: no flank tenderness  Ext: No edema  MS: Adequate ROM spine, shoulders, hips and knees.  Skin: Intact, no ulcerations or rash noted.  Psych: Good eye contact, normal affect. Memory intact not anxious or depressed appearing.  CNS: CN 2-12 intact, power,  normal throughout.no focal deficits noted.   Assessment & Plan  Acute cystitis Symptomatic, abn CCUA, specimen sent for c/s , 3 day course of ampicillin prescribed  Dysuria CCUA in office abnormal, will send for c/s

## 2022-08-10 NOTE — Assessment & Plan Note (Signed)
CCUA in office abnormal, will send for c/s

## 2022-08-10 NOTE — Assessment & Plan Note (Signed)
Symptomatic, abn CCUA, specimen sent for c/s , 3 day course of ampicillin prescribed

## 2022-08-18 ENCOUNTER — Telehealth: Payer: Self-pay | Admitting: Internal Medicine

## 2022-08-18 LAB — UA/M W/RFLX CULTURE, ROUTINE
Bilirubin, UA: NEGATIVE
Glucose, UA: NEGATIVE
Nitrite, UA: NEGATIVE
RBC, UA: NEGATIVE
Specific Gravity, UA: 1.026 (ref 1.005–1.030)
Urobilinogen, Ur: 1 mg/dL (ref 0.2–1.0)
pH, UA: 7 (ref 5.0–7.5)

## 2022-08-18 LAB — MICROSCOPIC EXAMINATION
Casts: NONE SEEN /lpf
WBC, UA: >30 /hpf — AB (ref 0–5)

## 2022-08-18 LAB — URINE CULTURE, REFLEX

## 2022-08-18 MED ORDER — AMOXICILLIN-POT CLAVULANATE 875-125 MG PO TABS: 1.0000 | ORAL_TABLET | Freq: Two times a day (BID) | ORAL | 0 refills | Status: DC

## 2022-08-18 NOTE — Telephone Encounter (Signed)
Pt returned call

## 2022-08-18 NOTE — Addendum Note (Signed)
Addended by: Fayrene Helper on: 08/18/2022 06:28 AM   Modules accepted: Orders

## 2022-08-18 NOTE — Telephone Encounter (Signed)
Spoke to patient

## 2022-09-05 ENCOUNTER — Other Ambulatory Visit: Payer: Self-pay | Admitting: Nurse Practitioner

## 2022-09-06 ENCOUNTER — Telehealth: Payer: Self-pay | Admitting: Internal Medicine

## 2022-09-06 ENCOUNTER — Other Ambulatory Visit: Payer: Self-pay

## 2022-09-06 MED ORDER — ATORVASTATIN CALCIUM 40 MG PO TABS: ORAL_TABLET | ORAL | 1 refills | Status: DC

## 2022-09-06 NOTE — Telephone Encounter (Signed)
Refills sent to pharmacy. 

## 2022-09-06 NOTE — Telephone Encounter (Signed)
Patient called need refill   atorvastatin (LIPITOR) 40 MG tablet [id number]    [place]

## 2022-09-12 ENCOUNTER — Encounter: Payer: Self-pay | Admitting: Family Medicine

## 2022-09-12 ENCOUNTER — Ambulatory Visit (INDEPENDENT_AMBULATORY_CARE_PROVIDER_SITE_OTHER): Payer: No Typology Code available for payment source | Admitting: Family Medicine

## 2022-09-12 VITALS — BP 134/73 | HR 87 | Ht 62.0 in | Wt 170.1 lb

## 2022-09-12 DIAGNOSIS — H6121 Impacted cerumen, right ear: Secondary | ICD-10-CM

## 2022-09-12 HISTORY — DX: Impacted cerumen, right ear: H61.21

## 2022-09-12 NOTE — Patient Instructions (Addendum)
I appreciate the opportunity to provide care to you today!    Follow up:  as needed   I recommend using Debrox earwax removal kit      Please continue to a heart-healthy diet and increase your physical activities. Try to exercise for 20mns at least five times a week.      It was a pleasure to see you and I look forward to continuing to work together on your health and well-being. Please do not hesitate to call the office if you need care or have questions about your care.   Have a wonderful day and week. With Gratitude, GAlvira MondayMSN, FNP-BC

## 2022-09-12 NOTE — Progress Notes (Signed)
Acute Office Visit  Subjective:    Patient ID: Emma Roberts, female    DOB: 01-09-1956, 67 y.o.   MRN: [id number]  Chief Complaint  Patient presents with   Ear Pain    Pt reports right ear pain off and on since 2-3 weeks ago. (08/22/2022).     HPI Patient is in today with complaints of right ear pain. For the details of today's visit, please refer to the assessment and plan.     Past Medical History:  Diagnosis Date   Cancer ([place])    breast cancer in the past   COVID-19 12/29/2020   GERD (gastroesophageal reflux disease)    High cholesterol     Past Surgical History:  Procedure Laterality Date   BREAST SURGERY     mastectomy and reconstruction using belly fat    MASTECTOMY Right     Family History  Problem Relation Age of Onset   Breast cancer Mother    Stroke Other    Heart attack Other    Alzheimer's disease Neg Hx    Dementia Neg Hx     Social History   Socioeconomic History   Marital status: Single    Spouse name: Not on file   Number of children: Not on file   Years of education: Not on file   Highest education level: Some college, no degree  Occupational History   Not on file  Tobacco Use   Smoking status: Never   Smokeless tobacco: Never  Vaping Use   Vaping Use: Never used  Substance and Sexual Activity   Alcohol use: Not Currently    Comment: very rarely    Drug use: Never   Sexual activity: Not on file  Other Topics Concern   Not on file  Social History Narrative   Lives with her daughter   Left handed   Caffeine: 0   Social Determinants of Health   Financial Resource Strain: Low Risk  (03/20/2022)   Overall Financial Resource Strain (CARDIA)    Difficulty of Paying Living Expenses: Not hard at all  Food Insecurity: No Food Insecurity (03/20/2022)   Hunger Vital Sign    Worried About Running Out of Food in the Last Year: Never true    Ran Out of Food in the Last Year: Never true  Transportation Needs: No Transportation Needs  (03/20/2022)   PRAPARE - Hydrologist (Medical): No    Lack of Transportation (Non-Medical): No  Physical Activity: Sufficiently Active (03/20/2022)   Exercise Vital Sign    Days of Exercise per Week: 5 days    Minutes of Exercise per Session: 30 min  Stress: No Stress Concern Present (03/20/2022)   [place]    Feeling of Stress : Not at all  Social Connections: Socially Isolated (03/20/2022)   Social Connection and Isolation Panel [NHANES]    Frequency of Communication with Friends and Family: More than three times a week    Frequency of Social Gatherings with Friends and Family: More than three times a week    Attends Religious Services: Never    Marine scientist or Organizations: No    Attends Archivist Meetings: Never    Marital Status: Divorced  Human resources officer Violence: Not At Risk (03/20/2022)   Humiliation, Afraid, Rape, and Kick questionnaire    Fear of Current or Ex-Partner: No    Emotionally Abused: No    Physically Abused:  No    Sexually Abused: No    Outpatient Medications Prior to Visit  Medication Sig Dispense Refill   atorvastatin (LIPITOR) 40 MG tablet TAKE 1 TABLET(40 MG) BY MOUTH DAILY 90 tablet 1   esomeprazole (NEXIUM) 40 MG capsule TAKE 1 CAPSULE BY MOUTH EVERY DAY AT NOON 30 capsule 3   FLUoxetine (PROZAC) 40 MG capsule Take 1 capsule (40 mg total) by mouth daily. 90 capsule 3   Multiple Vitamin (MULTIVITAMIN PO) Take 2 each by mouth.     Multiple Vitamins-Minerals (ALGAE BASED CALCIUM) TABS Take 2 each by mouth at bedtime. Algae Cal Strontium     amoxicillin-clavulanate (AUGMENTIN) 875-125 MG tablet Take 1 tablet by mouth 2 (two) times daily. 10 tablet 0   No facility-administered medications prior to visit.    Allergies  Allergen Reactions   Sulfamethoxazole-Trimethoprim Anaphylaxis    Other reaction(s): Other (See Comments) Other Reaction:  swelling mouth and tongue    Macrobid [Nitrofurantoin] Hives and Itching    Pt broke out in rash and hives after taking     Review of Systems  Constitutional:  Negative for chills and fever.  HENT:  Positive for ear pain.   Eyes:  Negative for visual disturbance.  Respiratory:  Negative for chest tightness and shortness of breath.   Neurological:  Negative for dizziness and headaches.       Objective:    Physical Exam HENT:     Head: Normocephalic.     Right Ear: Tympanic membrane and ear canal normal. No decreased hearing noted. No drainage, swelling or tenderness. There is impacted cerumen.     Left Ear: No decreased hearing noted. No drainage. There is no impacted cerumen.     Mouth/Throat:     Mouth: Mucous membranes are moist.  Cardiovascular:     Rate and Rhythm: Normal rate.     Heart sounds: Normal heart sounds.  Pulmonary:     Effort: Pulmonary effort is normal.     Breath sounds: Normal breath sounds.  Neurological:     Mental Status: She is alert.     BP 134/73   Pulse 87   Ht 5' 2"$  (1.575 m)   Wt 170 lb 1.9 oz (77.2 kg)   SpO2 94%   BMI 31.12 kg/m  Wt Readings from Last 3 Encounters:  09/12/22 170 lb 1.9 oz (77.2 kg)  08/10/22 172 lb (78 kg)  06/23/22 169 lb 6.4 oz (76.8 kg)       Assessment & Plan:  Impacted cerumen, right ear Assessment & Plan: Tympanic membrane is pearly gray in color, translucent and  shiny No pain with palpation of the right auricle Impacted cerumen noted in the right ear Irrigation performed Encouraged to use over-the-counter Debrox cerumen removal kit     Alvira Monday, FNP

## 2022-09-12 NOTE — Assessment & Plan Note (Signed)
Tympanic membrane is pearly gray in color, translucent and  shiny No pain with palpation of the right auricle Impacted cerumen noted in the right ear Irrigation performed Encouraged to use over-the-counter Debrox cerumen removal kit

## 2022-09-22 ENCOUNTER — Ambulatory Visit (INDEPENDENT_AMBULATORY_CARE_PROVIDER_SITE_OTHER): Payer: No Typology Code available for payment source | Admitting: Internal Medicine

## 2022-09-22 ENCOUNTER — Encounter: Payer: Self-pay | Admitting: Internal Medicine

## 2022-09-22 VITALS — BP 124/77 | HR 87 | Ht 62.0 in | Wt 174.6 lb

## 2022-09-22 DIAGNOSIS — H61891 Other specified disorders of right external ear: Secondary | ICD-10-CM

## 2022-09-22 DIAGNOSIS — R7303 Prediabetes: Secondary | ICD-10-CM | POA: Diagnosis not present

## 2022-09-22 DIAGNOSIS — E782 Mixed hyperlipidemia: Secondary | ICD-10-CM

## 2022-09-22 DIAGNOSIS — F322 Major depressive disorder, single episode, severe without psychotic features: Secondary | ICD-10-CM

## 2022-09-22 DIAGNOSIS — K219 Gastro-esophageal reflux disease without esophagitis: Secondary | ICD-10-CM | POA: Diagnosis not present

## 2022-09-22 NOTE — Assessment & Plan Note (Signed)
Mood remains stable on Prozac 40 mg daily.  PHQ-9 score 4 today.  Denies SI/HI. -No medication changes today.

## 2022-09-22 NOTE — Patient Instructions (Signed)
It was a pleasure to see you today.  Thank you for giving [place] the opportunity to be involved in your care.  Below is a brief recap of your visit and next steps.  We will plan to see you again in 4 months.  Summary No medication changes today.  I recommend purchasing [employer]'s Dry Ear Relief Oil (Pink Box) for your ear discomfort We will follow up in 4 months and repeat labs at that time

## 2022-09-22 NOTE — Progress Notes (Signed)
Established Patient Office Visit  Subjective   Patient ID: Emma Roberts, female    DOB: 10/19/1955  Age: 67 y.o. MRN: [id number]  Chief Complaint  Patient presents with   Follow-up    Follow up seen last week for R ear flush states still dosent feel right   Emma Roberts returns to care today for routine follow-up.  She was last evaluated by me on 06/23/22 for her annual exam.  In the interim she has presented to [facility] on 2 occasions for UTI symptoms and impacted cerumen of the right ear.  There have otherwise been no acute interval events.  Today Emma Roberts reports feeling fairly well.  She continues to endorse right ear discomfort.  She is otherwise asymptomatic and has no additional concerns to discuss.  Past Medical History:  Diagnosis Date   Acute otitis media, right 05/05/2022   Cancer ([facility])    breast cancer in the past   COVID-19 12/29/2020   GERD (gastroesophageal reflux disease)    High cholesterol    Impacted cerumen, right ear 09/12/2022   Serous otitis media 02/25/2021   Past Surgical History:  Procedure Laterality Date   BREAST SURGERY     mastectomy and reconstruction using belly fat    MASTECTOMY Right    Social History   Tobacco Use   Smoking status: Never   Smokeless tobacco: Never  Vaping Use   Vaping Use: Never used  Substance Use Topics   Alcohol use: Not Currently    Comment: very rarely    Drug use: Never   Family History  Problem Relation Age of Onset   Breast cancer Mother    Stroke Other    Heart attack Other    Alzheimer's disease Neg Hx    Dementia Neg Hx    Allergies  Allergen Reactions   Sulfamethoxazole-Trimethoprim Anaphylaxis    Other reaction(s): Other (See Comments) Other Reaction: swelling mouth and tongue    Macrobid [Nitrofurantoin] Hives and Itching    Pt broke out in rash and hives after taking     Review of Systems  Constitutional:  Negative for chills and fever.  HENT:  Positive for ear pain (Right ear discomfort).  Negative for sore throat.   Respiratory:  Negative for cough and shortness of breath.   Cardiovascular:  Negative for chest pain, palpitations and leg swelling.  Gastrointestinal:  Negative for abdominal pain, blood in stool, constipation, diarrhea, nausea and vomiting.  Genitourinary:  Negative for dysuria and hematuria.  Musculoskeletal:  Negative for myalgias.  Skin:  Negative for itching and rash.  Neurological:  Negative for dizziness and headaches.  Psychiatric/Behavioral:  Negative for depression and suicidal ideas.       Objective:     BP 124/77 (BP Location: Right Arm, Patient Position: Sitting, Cuff Size: Large)   Pulse 87   Ht '5\' 2"'$  (1.575 m)   Wt 174 lb 9.6 oz (79.2 kg)   SpO2 96%   BMI 31.93 kg/m  BP Readings from Last 3 Encounters:  09/22/22 124/77  09/12/22 134/73  08/10/22 120/77   Physical Exam Vitals reviewed.  Constitutional:      General: She is not in acute distress.    Appearance: Normal appearance. She is not toxic-appearing.  HENT:     Head: Normocephalic and atraumatic.     Right Ear: External ear normal.     Left Ear: External ear normal.     Ears:     Comments: Erythematous, dried skin noted in  the right ear canal    Nose: Nose normal. No congestion.     Mouth/Throat:     Mouth: Mucous membranes are moist.     Pharynx: Oropharynx is clear. No oropharyngeal exudate.  Eyes:     General: No scleral icterus.    Extraocular Movements: Extraocular movements intact.     Conjunctiva/sclera: Conjunctivae normal.     Pupils: Pupils are equal, round, and reactive to light.  Cardiovascular:     Rate and Rhythm: Normal rate and regular rhythm.     Pulses: Normal pulses.     Heart sounds: Normal heart sounds. No murmur heard.    No friction rub. No gallop.  Pulmonary:     Effort: Pulmonary effort is normal.     Breath sounds: Normal breath sounds. No wheezing, rhonchi or rales.  Abdominal:     General: Abdomen is flat. Bowel sounds are normal.  There is no distension.     Palpations: Abdomen is soft.     Tenderness: There is no abdominal tenderness.  Musculoskeletal:        General: No swelling.     Cervical back: Normal range of motion.     Right lower leg: No edema.     Left lower leg: No edema.  Lymphadenopathy:     Cervical: No cervical adenopathy.  Skin:    General: Skin is warm and dry.     Capillary Refill: Capillary refill takes less than 2 seconds.     Coloration: Skin is not jaundiced.  Neurological:     General: No focal deficit present.     Mental Status: She is alert and oriented to person, place, and time.     Cranial Nerves: No cranial nerve deficit.     Sensory: No sensory deficit.     Motor: No weakness.     Coordination: Coordination normal.     Gait: Gait normal.  Psychiatric:        Mood and Affect: Mood normal.        Behavior: Behavior normal.        Thought Content: Thought content normal.   Last CBC Lab Results  Component Value Date   WBC 8.0 12/06/2021   HGB 15.4 12/06/2021   HCT 44.3 12/06/2021   MCV 92 12/06/2021   MCH 32.1 12/06/2021   RDW 12.9 12/06/2021   PLT 305 [id number]   Last metabolic panel Lab Results  Component Value Date   GLUCOSE 82 12/06/2021   NA 139 12/06/2021   K 4.5 12/06/2021   CL 100 12/06/2021   CO2 24 12/06/2021   BUN 19 12/06/2021   CREATININE 0.96 12/06/2021   EGFR 65 12/06/2021   CALCIUM 10.6 (H) 12/06/2021   PROT 7.2 12/06/2021   ALBUMIN 4.6 12/06/2021   LABGLOB 2.6 12/06/2021   AGRATIO 1.8 12/06/2021   BILITOT 0.6 12/06/2021   ALKPHOS 133 (H) 12/06/2021   AST 32 12/06/2021   ALT 31 12/06/2021   Last lipids Lab Results  Component Value Date   CHOL 205 (H) 06/23/2022   HDL 81 06/23/2022   LDLCALC 105 (H) 06/23/2022   TRIG 110 06/23/2022   CHOLHDL 2.5 06/23/2022   Last hemoglobin A1c Lab Results  Component Value Date   HGBA1C 6.2 (H) 06/23/2022   Last thyroid functions Lab Results  Component Value Date   TSH 2.410 12/06/2021    Last vitamin D Lab Results  Component Value Date   VD25OH 46.0 06/23/2022   Last vitamin B12 and Folate  Lab Results  Component Value Date   VITAMINB12 908 12/06/2021   FOLATE >20.0 12/06/2021    The 10-year ASCVD risk score (Arnett DK, et al., 2019) is: 6.5%    Assessment & Plan:   Problem List Items Addressed This Visit       GERD (gastroesophageal reflux disease)    Symptoms remain well-controlled with Nexium 40 mg daily.      Dry right ear canal    She continues to endorse right ear discomfort today.  Presented last week for evaluation and was found to have impacted cerumen of the right ear that was successfully irrigated.  Examination today she has dried, erythematous skin in the right ear canal.  TM appears normal.. -I have recommended that she purchase lubricating eardrops.  If this does not relieve her symptoms she will return to care.      Hyperlipidemia    Currently prescribed atorvastatin 40 mg daily.  Lipid panel last updated in December 2023.  Total cholesterol 205 and LDL 105.  Her 10-year ASCVD risk score today is 6.5%. -No medication changes today.  Repeat lipid panel at follow-up in 4 months.      Depression, major, single episode, severe (HCC)    Mood remains stable on Prozac 40 mg daily.  PHQ-9 score 4 today.  Denies SI/HI. -No medication changes today.      Prediabetes    A1c 6.2 on labs from December 2023.  Today we reviewed lifestyle modifications aimed at improving her average blood sugar such as dietary changes and incorporating regular exercise into her weekly routine.- -Repeat A1c at follow-up in 4 months      Return in about 4 months (around 01/22/2023).   Johnette Abraham, MD

## 2022-09-22 NOTE — Assessment & Plan Note (Signed)
A1c 6.2 on labs from December 2023.  Today we reviewed lifestyle modifications aimed at improving her average blood sugar such as dietary changes and incorporating regular exercise into her weekly routine.- -Repeat A1c at follow-up in 4 months

## 2022-09-22 NOTE — Assessment & Plan Note (Signed)
Symptoms remain well-controlled with Nexium 40 mg daily.

## 2022-09-22 NOTE — Assessment & Plan Note (Signed)
Currently prescribed atorvastatin 40 mg daily.  Lipid panel last updated in December 2023.  Total cholesterol 205 and LDL 105.  Her 10-year ASCVD risk score today is 6.5%. -No medication changes today.  Repeat lipid panel at follow-up in 4 months.

## 2022-09-22 NOTE — Assessment & Plan Note (Signed)
She continues to endorse right ear discomfort today.  Presented last week for evaluation and was found to have impacted cerumen of the right ear that was successfully irrigated.  Examination today she has dried, erythematous skin in the right ear canal.  TM appears normal.. -I have recommended that she purchase lubricating eardrops.  If this does not relieve her symptoms she will return to care.

## 2022-10-02 ENCOUNTER — Other Ambulatory Visit: Payer: Self-pay | Admitting: Nurse Practitioner

## 2022-10-05 NOTE — Telephone Encounter (Signed)
Per pt provider medication sent to pt pharmacy. Emma Roberts

## 2023-01-22 ENCOUNTER — Ambulatory Visit: Payer: No Typology Code available for payment source | Admitting: Internal Medicine

## 2023-01-29 ENCOUNTER — Other Ambulatory Visit: Payer: Self-pay | Admitting: Internal Medicine

## 2023-02-05 ENCOUNTER — Other Ambulatory Visit: Payer: Self-pay | Admitting: Internal Medicine

## 2023-02-05 DIAGNOSIS — F339 Major depressive disorder, recurrent, unspecified: Secondary | ICD-10-CM

## 2023-02-15 ENCOUNTER — Ambulatory Visit: Payer: Medicare Other | Admitting: Internal Medicine

## 2023-02-20 ENCOUNTER — Encounter: Payer: Self-pay | Admitting: Internal Medicine

## 2023-03-07 ENCOUNTER — Other Ambulatory Visit: Payer: Self-pay | Admitting: Nurse Practitioner

## 2023-05-16 DIAGNOSIS — H2513 Age-related nuclear cataract, bilateral: Secondary | ICD-10-CM | POA: Diagnosis not present

## 2023-05-16 DIAGNOSIS — H524 Presbyopia: Secondary | ICD-10-CM | POA: Diagnosis not present

## 2023-05-16 DIAGNOSIS — H43813 Vitreous degeneration, bilateral: Secondary | ICD-10-CM | POA: Diagnosis not present

## 2023-07-02 ENCOUNTER — Other Ambulatory Visit: Payer: Self-pay | Admitting: Internal Medicine

## 2023-07-22 ENCOUNTER — Ambulatory Visit
Admission: EM | Admit: 2023-07-22 | Discharge: 2023-07-22 | Disposition: A | Discharge status: Home / Self Care | Payer: Medicare Other

## 2023-07-22 DIAGNOSIS — J069 Acute upper respiratory infection, unspecified: Secondary | ICD-10-CM

## 2023-07-22 DIAGNOSIS — Z20828 Contact with and (suspected) exposure to other viral communicable diseases: Secondary | ICD-10-CM | POA: Diagnosis not present

## 2023-07-22 NOTE — ED Triage Notes (Signed)
Pt reports she has a cough, raw throat, congestion, and bilateral ear pain, bloody mucus x 3-4 days

## 2023-07-22 NOTE — ED Provider Notes (Signed)
[facility]    CSN: [id number] Arrival date & time: 07/22/23  1156      History   Chief Complaint No chief complaint on file.   HPI Emma Roberts is a 67 y.o. female.   Presenting today with 3 to 4-day history of cough, sore throat, congestion, bilateral ear pain and pressure.  Denies fever, chest pain, shortness of breath, abdominal pain, nausea vomiting or diarrhea.  Taking over-the-counter cold and congestion medications with moderate relief.  No known history of chronic pulmonary disease.  States she thinks she got this from her granddaughter who tested positive for RSV.    Past Medical History:  Diagnosis Date   Acute otitis media, right 05/05/2022   Cancer ([facility])    breast cancer in the past   COVID-19 12/29/2020   GERD (gastroesophageal reflux disease)    High cholesterol    Impacted cerumen, right ear 09/12/2022   Serous otitis media 02/25/2021    Patient Active Problem List   Diagnosis Date Noted   Prediabetes 09/22/2022   Dry right ear canal 09/22/2022   Acute cystitis 08/10/2022   Dysuria 08/10/2022   Allergies 11/23/2021   Numbness of tongue 04/21/2021   Subjective memory complaints 12/09/2020   Depression, major, single episode, severe (HCC) 10/20/2020   Encounter for well adult exam with abnormal findings 10/04/2020   Screening due 10/04/2020   GERD (gastroesophageal reflux disease) 10/04/2020   Insomnia 10/04/2020   Hyperlipidemia 10/04/2020   Memory loss 10/04/2020   Depression, recurrent (HCC) 10/04/2020    Past Surgical History:  Procedure Laterality Date   BREAST SURGERY     mastectomy and reconstruction using belly fat    MASTECTOMY Right     OB History   No obstetric history on file.      Home Medications    Prior to Admission medications   Medication Sig Start Date End Date Taking? Authorizing Provider  atorvastatin (LIPITOR) 40 MG tablet TAKE 1 TABLET(40 MG) BY MOUTH DAILY 03/08/23   Billie Lade, MD   esomeprazole (NEXIUM) 40 MG capsule TAKE 1 CAPSULE BY MOUTH EVERY DAY AT NOON 07/02/23   Billie Lade, MD  FLUoxetine (PROZAC) 40 MG capsule TAKE 1 CAPSULE(40 MG) BY MOUTH DAILY 02/05/23   Billie Lade, MD  Multiple Vitamin (MULTIVITAMIN PO) Take 2 each by mouth.    [provider]  Multiple Vitamins-Minerals (ALGAE BASED CALCIUM) TABS Take 2 each by mouth at bedtime. Algae Cal Strontium    [provider]    Family History Family History  Problem Relation Age of Onset   Breast cancer Mother    Stroke Other    Heart attack Other    Alzheimer's disease Neg Hx    Dementia Neg Hx     Social History Social History   Tobacco Use   Smoking status: Never   Smokeless tobacco: Never  Vaping Use   Vaping status: Never Used  Substance Use Topics   Alcohol use: Not Currently    Comment: very rarely    Drug use: Never     Allergies   Sulfamethoxazole-trimethoprim and Macrobid [nitrofurantoin]   Review of Systems Review of Systems Per HPI  Physical Exam Triage Vital Signs ED Triage Vitals [07/22/23 1305]  Encounter Vitals Group     BP 104/64     Systolic BP Percentile      Diastolic BP Percentile      Pulse Rate 74     Resp 18  Temp 98.5 F (36.9 C)     Temp Source Oral     SpO2 96 %     Weight      Height      Head Circumference      Peak Flow      Pain Score 0     Pain Loc      Pain Education      Exclude from Growth Chart    No data found.  Updated Vital Signs BP 104/64 (BP Location: Right Arm)   Pulse 74   Temp 98.5 F (36.9 C) (Oral)   Resp 18   SpO2 96%   Visual Acuity Right Eye Distance:   Left Eye Distance:   Bilateral Distance:    Right Eye Near:   Left Eye Near:    Bilateral Near:     Physical Exam Vitals and nursing note reviewed.  Constitutional:      Appearance: Normal appearance.  HENT:     Head: Atraumatic.     Right Ear: Tympanic membrane and external ear normal.     Left Ear: Tympanic membrane and  external ear normal.     Nose: Rhinorrhea present.     Mouth/Throat:     Mouth: Mucous membranes are moist.     Pharynx: Posterior oropharyngeal erythema present.  Eyes:     Extraocular Movements: Extraocular movements intact.     Conjunctiva/sclera: Conjunctivae normal.  Cardiovascular:     Rate and Rhythm: Normal rate and regular rhythm.     Heart sounds: Normal heart sounds.  Pulmonary:     Effort: Pulmonary effort is normal.     Breath sounds: Normal breath sounds. No wheezing or rales.  Musculoskeletal:        General: Normal range of motion.     Cervical back: Normal range of motion and neck supple.  Skin:    General: Skin is warm and dry.  Neurological:     Mental Status: She is alert and oriented to person, place, and time.  Psychiatric:        Mood and Affect: Mood normal.        Thought Content: Thought content normal.      UC Treatments / Results  Labs (all labs ordered are listed, but only abnormal results are displayed) Labs Reviewed - No data to display  EKG   Radiology No results found.  Procedures Procedures (including critical care time)  Medications Ordered in UC Medications - No data to display  Initial Impression / Assessment and Plan / UC Course  I have reviewed the triage vital signs and the nursing notes.  Pertinent labs & imaging results that were available during my care of the patient were reviewed by me and considered in my medical decision making (see chart for details).     Vital signs and exam very reassuring today, consistent with viral respiratory infection.  She does have a known close exposure to RSV.  Declines RSV test today in clinic for confirmation, discussed viral nature of illness and to continue over-the-counter cold and congestion medications, nasal sprays, humidifiers, sinus rinses and other remedies.  Offered cough syrup but this was declined.  She is requesting antibiotic, discussed that this would not be helpful in this  situation as it is a viral illness. Final Clinical Impressions(s) / UC Diagnoses   Final diagnoses:  Viral URI with cough  Exposure to respiratory syncytial virus (RSV)     Discharge Instructions      Take  over-the-counter cold and congestion medications, Flonase nasal spray twice daily, sinus rinses, humidifiers, vapor rubs    ED Prescriptions   None    PDMP not reviewed this encounter.   Particia Nearing, [place] 07/22/23 [phone]

## 2023-07-22 NOTE — Discharge Instructions (Addendum)
Take over-the-counter cold and congestion medications, Flonase nasal spray twice daily, sinus rinses, humidifiers, vapor rubs

## 2023-07-30 ENCOUNTER — Telehealth: Payer: Self-pay

## 2023-07-30 NOTE — Telephone Encounter (Signed)
 Lvm to call and schedule appt.

## 2023-07-30 NOTE — Telephone Encounter (Signed)
 Needs appointment    Copied from CRM [id number]. Topic: Clinical - Medical Advice >> Jul 30, 2023 10:00 AM Powell B wrote: Reason for CRM: Patient calling because has had sinus infection symptoms/congestion x1 week, wants to know if RX can be sent in or if needs new appointment with PCP.

## 2023-08-01 ENCOUNTER — Encounter: Payer: Self-pay | Admitting: Family Medicine

## 2023-08-01 ENCOUNTER — Ambulatory Visit (INDEPENDENT_AMBULATORY_CARE_PROVIDER_SITE_OTHER): Payer: Medicare HMO | Admitting: Family Medicine

## 2023-08-01 VITALS — BP 118/70 | HR 63 | Ht 62.0 in | Wt 157.0 lb

## 2023-08-01 DIAGNOSIS — B349 Viral infection, unspecified: Secondary | ICD-10-CM

## 2023-08-01 MED ORDER — PROMETHAZINE-DM 6.25-15 MG/5ML PO SYRP: 5.0000 mL | ORAL_SOLUTION | Freq: Four times a day (QID) | ORAL | 0 refills | Status: AC | PRN

## 2023-08-01 NOTE — Assessment & Plan Note (Signed)
 Start taking Promethazine  DM 5 mL by mouth every 4 hours as needed for cold symptoms. Increase fluid intake and allow for plenty of rest. Take Tylenol as needed for pain, fever, or general discomfort. Use a humidifier at bedtime to help with cough and nasal congestion. For nasal congestion: The use of heated, humidified air is a safe and effective therapy. Saline nasal sprays may also help alleviate nasal symptoms of the common cold.

## 2023-08-01 NOTE — Patient Instructions (Addendum)
 I appreciate the opportunity to provide care to you today!    Follow up:  pcp  Viral Illness Start taking Promethazine  DM 5 mL by mouth every 4 hours as needed for cold symptoms. Increase fluid intake and allow for plenty of rest. Take Tylenol as needed for pain, fever, or general discomfort. Use a humidifier at bedtime to help with cough and nasal congestion. For nasal congestion: The use of heated, humidified air is a safe and effective therapy. Saline nasal sprays may also help alleviate nasal symptoms of the common cold.   Attached with your AVS, you will find valuable resources for self-education. I highly recommend dedicating some time to thoroughly examine them.   Please continue to a heart-healthy diet and increase your physical activities. Try to exercise for at least five days a week.    It was a pleasure to see you and I look forward to continuing to work together on your health and well-being. Please do not hesitate to call the office if you need care or have questions about your care.  In case of emergency, please visit the Emergency Department for urgent care, or contact our clinic at [phone] to schedule an appointment. We're here to help you!   Have a wonderful day and week. With Gratitude, Reeshemah Nazaryan MSN, FNP-BC

## 2023-08-01 NOTE — Progress Notes (Signed)
 Established Patient Office Visit  Subjective:  Patient ID: Emma Roberts, female    DOB: 11-20-1955  Age: 68 y.o. MRN: [id number]  CC:  Chief Complaint  Patient presents with   Sinus Problem    Pt reports sx of sinus infection, has a cough and congested ongoing for 3 days.    HPI Emma Roberts is a 68 y.o. female presents with complaints of nasal congestion for the past 4 to 5 days. She denies experiencing facial pain and pressure, fever, chills, cough, or sore throat. She reports taking over-the-counter Mucinex and cough syrup with minimal relief. She has a 42-month-old grandchild at home with a runny nose. No body aches or headaches were reported.   Past Medical History:  Diagnosis Date   Acute otitis media, right 05/05/2022   Cancer (HCC)    breast cancer in the past   COVID-19 12/29/2020   GERD (gastroesophageal reflux disease)    High cholesterol    Impacted cerumen, right ear 09/12/2022   Serous otitis media 02/25/2021    Past Surgical History:  Procedure Laterality Date   BREAST SURGERY     mastectomy and reconstruction using belly fat    MASTECTOMY Right     Family History  Problem Relation Age of Onset   Breast cancer Mother    Stroke Other    Heart attack Other    Alzheimer's disease Neg Hx    Dementia Neg Hx     Social History   Socioeconomic History   Marital status: Single    Spouse name: Not on file   Number of children: Not on file   Years of education: Not on file   Highest education level: Some college, no degree  Occupational History   Not on file  Tobacco Use   Smoking status: Never   Smokeless tobacco: Never  Vaping Use   Vaping status: Never Used  Substance and Sexual Activity   Alcohol use: Not Currently    Comment: very rarely    Drug use: Never   Sexual activity: Not on file  Other Topics Concern   Not on file  Social History Narrative   Lives with her daughter   Left handed   Caffeine: 0   Social Drivers of Health    Financial Resource Strain: Medium Risk (02/11/2023)   Overall Financial Resource Strain (CARDIA)    Difficulty of Paying Living Expenses: Somewhat hard  Food Insecurity: Food Insecurity Present (02/11/2023)   Hunger Vital Sign    Worried About Running Out of Food in the Last Year: Often true    Ran Out of Food in the Last Year: Often true  Transportation Needs: No Transportation Needs (02/11/2023)   PRAPARE - Administrator, Civil Service (Medical): No    Lack of Transportation (Non-Medical): No  Physical Activity: Sufficiently Active (02/11/2023)   Exercise Vital Sign    Days of Exercise per Week: 5 days    Minutes of Exercise per Session: 50 min  Stress: No Stress Concern Present (02/11/2023)   [employer] of Occupational Health - Occupational Stress Questionnaire    Feeling of Stress : Only a little  Social Connections: Socially Isolated (02/11/2023)   Social Connection and Isolation Panel [NHANES]    Frequency of Communication with Friends and Family: Three times a week    Frequency of Social Gatherings with Friends and Family: Once a week    Attends Religious Services: Never    Database Administrator or Organizations: No  Attends Banker Meetings: Not on file    Marital Status: Divorced  Intimate Partner Violence: Not At Risk (03/20/2022)   Humiliation, Afraid, Rape, and Kick questionnaire    Fear of Current or Ex-Partner: No    Emotionally Abused: No    Physically Abused: No    Sexually Abused: No    Outpatient Medications Prior to Visit  Medication Sig Dispense Refill   atorvastatin  (LIPITOR) 40 MG tablet TAKE 1 TABLET(40 MG) BY MOUTH DAILY 90 tablet 1   esomeprazole  (NEXIUM ) 40 MG capsule TAKE 1 CAPSULE BY MOUTH EVERY DAY AT NOON 30 capsule 3   FLUoxetine  (PROZAC ) 40 MG capsule TAKE 1 CAPSULE(40 MG) BY MOUTH DAILY 90 capsule 3   Multiple Vitamin (MULTIVITAMIN PO) Take 2 each by mouth.     Multiple Vitamins-Minerals (ALGAE BASED CALCIUM )  TABS Take 2 each by mouth at bedtime. Algae Cal Strontium     No facility-administered medications prior to visit.    Allergies  Allergen Reactions   Sulfamethoxazole-Trimethoprim Anaphylaxis    Other reaction(s): Other (See Comments) Other Reaction: swelling mouth and tongue    Macrobid  [Nitrofurantoin ] Hives and Itching    Pt broke out in rash and hives after taking     ROS Review of Systems  Constitutional:  Negative for chills and fever.  HENT:  Positive for congestion. Negative for rhinorrhea, sinus pressure, sinus pain and sore throat.   Eyes:  Negative for visual disturbance.  Respiratory:  Negative for chest tightness and shortness of breath.   Neurological:  Negative for dizziness and headaches.      Objective:    Physical Exam HENT:     Head: Normocephalic.     Mouth/Throat:     Mouth: Mucous membranes are moist.  Cardiovascular:     Rate and Rhythm: Normal rate.     Heart sounds: Normal heart sounds.  Pulmonary:     Effort: Pulmonary effort is normal.     Breath sounds: Normal breath sounds.  Neurological:     Mental Status: She is alert.     BP 118/70   Pulse 63   Ht 5' 2 (1.575 m)   Wt 157 lb 0.6 oz (71.2 kg)   SpO2 100%   BMI 28.72 kg/m  Wt Readings from Last 3 Encounters:  08/01/23 157 lb 0.6 oz (71.2 kg)  09/22/22 174 lb 9.6 oz (79.2 kg)  09/12/22 170 lb 1.9 oz (77.2 kg)    Lab Results  Component Value Date   TSH 2.410 12/06/2021   Lab Results  Component Value Date   WBC 8.0 12/06/2021   HGB 15.4 12/06/2021   HCT 44.3 12/06/2021   MCV 92 12/06/2021   PLT 305 12/06/2021   Lab Results  Component Value Date   NA 139 12/06/2021   K 4.5 12/06/2021   CO2 24 12/06/2021   GLUCOSE 82 12/06/2021   BUN 19 12/06/2021   CREATININE 0.96 12/06/2021   BILITOT 0.6 12/06/2021   ALKPHOS 133 (H) 12/06/2021   AST 32 12/06/2021   ALT 31 12/06/2021   PROT 7.2 12/06/2021   ALBUMIN 4.6 12/06/2021   CALCIUM  10.6 (H) 12/06/2021   EGFR 65  12/06/2021   Lab Results  Component Value Date   CHOL 205 (H) 06/23/2022   Lab Results  Component Value Date   HDL 81 06/23/2022   Lab Results  Component Value Date   LDLCALC 105 (H) 06/23/2022   Lab Results  Component Value Date   TRIG 110  06/23/2022   Lab Results  Component Value Date   CHOLHDL 2.5 06/23/2022   Lab Results  Component Value Date   HGBA1C 6.2 (H) 06/23/2022      Assessment & Plan:  Viral illness Assessment & Plan: Start taking Promethazine  DM 5 mL by mouth every 4 hours as needed for cold symptoms. Increase fluid intake and allow for plenty of rest. Take Tylenol as needed for pain, fever, or general discomfort. Use a humidifier at bedtime to help with cough and nasal congestion. For nasal congestion: The use of heated, humidified air is a safe and effective therapy. Saline nasal sprays may also help alleviate nasal symptoms of the common cold.  Orders: -     COVID-19, Flu A+B and RSV -     Promethazine -DM; Take 5 mLs by mouth 4 (four) times daily as needed.  Dispense: 118 mL; Refill: 0   Note: This chart has been completed using Engineer, Civil (consulting) software, and while attempts have been made to ensure accuracy, certain words and phrases may not be transcribed as intended.   Follow-up: No follow-ups on file.   Emilija Bohman, FNP

## 2023-08-03 LAB — COVID-19, FLU A+B AND RSV
Influenza A, NAA: NOT DETECTED
Influenza B, NAA: NOT DETECTED
RSV, NAA: NOT DETECTED
SARS-CoV-2, NAA: NOT DETECTED

## 2023-08-03 NOTE — Progress Notes (Signed)
 Please inform the patient that her respiratory panel for COVID, RSV, and flu came back negative.

## 2023-08-06 ENCOUNTER — Telehealth: Payer: Self-pay

## 2023-08-06 MED ORDER — FLUTICASONE PROPIONATE 50 MCG/ACT NA SUSP: 2.0000 | Freq: Every day | NASAL | 6 refills | Status: AC

## 2023-08-06 NOTE — Telephone Encounter (Signed)
 Copied from CRM [id number]. Topic: Clinical - Medical Advice >> Aug 02, 2023 12:40 PM Delon T wrote: Reason for CRM: Patient states she does not need cough medication, needs something for head congestion and runny nose. Wants to know if something can be called for the head congestion, nothing else is working.  Please call patient [phone].

## 2023-08-06 NOTE — Telephone Encounter (Signed)
 Patient advised, lvm.

## 2023-08-06 NOTE — Telephone Encounter (Signed)
 Copied from CRM [id number]. Topic: Clinical - Medication Question >> Aug 03, 2023 10:37 AM Bascom RAMAN wrote: Reason for CRM: Patient was prescribed promethazine -dextromethorphan (PROMETHAZINE -DM) 6.25-15 MG/5ML syrup but states that it is her sinus that is giving her the issue. Please prescribe something for her sinus because she does not have a cough. Callback number is [phone]

## 2023-08-06 NOTE — Telephone Encounter (Signed)
 Copied from CRM [id number]. Topic: Clinical - Prescription Issue >> Aug 06, 2023 10:24 AM Graeme ORN wrote: Reason for CRM: Patient called back requesting to speak to [facility]. Stated she received a message from pharmacy that Rx was called in and its for nasal spray that she already has. Its not working she is needing something else sent in to help relieve symptoms. Thank You

## 2023-08-06 NOTE — Telephone Encounter (Signed)
 Patient last seen 07/31/22 with NP Malachi Bonds

## 2023-08-07 ENCOUNTER — Ambulatory Visit: Payer: Self-pay | Admitting: Internal Medicine

## 2023-08-07 MED ORDER — OXYMETAZOLINE HCL 0.05 % NA SOLN: 1.0000 | Freq: Two times a day (BID) | NASAL | 0 refills | Status: AC

## 2023-08-07 NOTE — Addendum Note (Signed)
 Addended by: Christel Mormon E on: 08/07/2023 08:11 AM   Modules accepted: Orders

## 2023-08-07 NOTE — Telephone Encounter (Addendum)
 Copied from CRM [id number]. Topic: Clinical - Red Word Triage >> Aug 07, 2023  9:19 AM Emma Roberts wrote: Pt states she's running a fever  Chief Complaint: Sinus congestion Symptoms: Congestion Frequency: Since Friday of last week Pertinent Negatives: Patient denies relief Disposition: [] ED /[] Urgent Care (no appt availability in office) / [] Appointment(In office/virtual)/ []  [facility]/ [x] Home Care/ [] Refused Recommended Disposition /[] [facility]/ []  Follow-up with PCP Additional Notes: Patient called in to report persitant sinus congestion. Patient was seen and treated in office last Friday for sinus symptoms related to a viral illness. Patient stated what she was prescribed at that office visit is not helping. Patient denies sinus pain and fever at this time. Patient stated that she has not had a fever for past day or two. Patient reported green discharge. Patient seeking additional medication for sinus symptoms at this time. Advised patient that I would route conversation to clinic. Advised patient to continue home care and try an OTC nasal decongestant for relief. Also advised patient she could use Tylenol if her fever returns. Patient complied.   Reason for Disposition  [1] Sinus congestion as part of a cold AND [2] present < 10 days  Answer Assessment - Initial Assessment Questions 1. LOCATION: Where does it hurt?      Denies pain  2. ONSET: When did the sinus pain start?  (e.g., hours, days)      Patient states symptoms started Friday of last week, was seen in office for viral illness  3. SEVERITY: How bad is the pain?   (Scale 1-10; mild, moderate or severe)   - MILD (1-3): doesn't interfere with normal activities    - MODERATE (4-7): interferes with normal activities (e.g., work or school) or awakens from sleep   - SEVERE (8-10): excruciating pain and patient unable to do any normal activities        Denies pain  4. RECURRENT SYMPTOM: Have you ever had  sinus problems before? If Yes, ask: When was the last time? and What happened that time?      Patient stated she was treated for sinus symptoms last Friday  5. NASAL CONGESTION: Is the nose blocked? If Yes, ask: Can you open it or must you breathe through your mouth?     Patient states she can breath out of her nose at the moment, but feels it is getting worse  6. NASAL DISCHARGE: Do you have discharge from your nose? If so ask, What color?     Yes, green  7. FEVER: Do you have a fever? If Yes, ask: What is it, how was it measured, and when did it start?      Patient denies fever at the moment and states it's been a couple of days since she had a fever  Protocols used: Sinus Pain or Congestion-A-AH

## 2023-08-07 NOTE — Telephone Encounter (Signed)
 This RN attempted to contact patient for triage. Left message requesting return call to clinic.  Copied from CRM [id number]. Topic: Clinical - Medical Advice >> Aug 07, 2023  8:55 AM Alfonso ORN wrote: Reason for CRM: patient came last week with a sinus infection  and have a fever 99.7 not getting any better requesting  for an  antibotic  medication that was given is not helping

## 2023-08-07 NOTE — Telephone Encounter (Signed)
 lvm

## 2023-08-07 NOTE — Telephone Encounter (Signed)
 RN Agent  notified patient on her  individualized with her Name voicemail. Notified patient her provider has sent over her medication to her pharmacy on file. Advised to call back if any further questions or assistance is needed.

## 2023-08-10 ENCOUNTER — Ambulatory Visit: Payer: Self-pay | Admitting: Internal Medicine

## 2023-08-10 NOTE — Telephone Encounter (Signed)
Messages have been left for the patient to call back after 3 phone call attempts

## 2023-08-10 NOTE — Telephone Encounter (Signed)
Copied from CRM [id number]. Topic: Clinical - Pink Word Triage >> Aug 10, 2023 12:14 PM Emma Roberts wrote: Red Word that prompted transfer to Nurse Triage: Has sinus infection and given nasal spray, but its making it worse. Now has blood coming from nose only when she blows it. She feels she needs an antibiotic. Please contact patient back.

## 2023-08-10 NOTE — Telephone Encounter (Signed)
Called patient for the 3rd attempt---no answer--left voicemail and routed to PCP office Reason for Disposition . Third attempt to contact caller AND no contact made. Phone number verified.  Protocols used: No Contact or Duplicate Contact Call-A-AH

## 2023-08-10 NOTE — Telephone Encounter (Signed)
Called patient for the 2nd attempt--Left a voicemail for patient to call back

## 2023-08-10 NOTE — Telephone Encounter (Signed)
Attempt made to call patient: no answer: left message on voicemail for patient to call. Will attempt to reach back out to patient again.

## 2023-08-12 DIAGNOSIS — J019 Acute sinusitis, unspecified: Secondary | ICD-10-CM | POA: Diagnosis not present

## 2023-08-30 DIAGNOSIS — Z0100 Encounter for examination of eyes and vision without abnormal findings: Secondary | ICD-10-CM | POA: Diagnosis not present

## 2023-10-04 ENCOUNTER — Other Ambulatory Visit (HOSPITAL_COMMUNITY): Payer: Self-pay | Admitting: Internal Medicine

## 2023-10-04 DIAGNOSIS — Z1231 Encounter for screening mammogram for malignant neoplasm of breast: Secondary | ICD-10-CM

## 2023-10-08 ENCOUNTER — Other Ambulatory Visit: Payer: Self-pay | Admitting: Internal Medicine

## 2023-10-23 ENCOUNTER — Other Ambulatory Visit: Payer: Self-pay | Admitting: Internal Medicine

## 2023-11-02 DIAGNOSIS — J019 Acute sinusitis, unspecified: Secondary | ICD-10-CM | POA: Diagnosis not present

## 2023-11-02 DIAGNOSIS — J029 Acute pharyngitis, unspecified: Secondary | ICD-10-CM | POA: Diagnosis not present

## 2023-12-13 ENCOUNTER — Ambulatory Visit: Payer: Self-pay | Admitting: Family Medicine

## 2023-12-13 ENCOUNTER — Ambulatory Visit: Payer: Self-pay

## 2023-12-13 NOTE — Telephone Encounter (Signed)
Noted patient scheduled

## 2023-12-13 NOTE — Telephone Encounter (Signed)
  Chief Complaint: R inner ear pain Symptoms: pain Frequency: about a week Pertinent Negatives: Patient denies fever, discharge, current URI Disposition: [] ED /[] Urgent Care (no appt availability in office) / [x] Appointment(In office/virtual)/ []  [facility]/ [] Home Care/ [] Refused Recommended Disposition /[] [facility]/ []  Follow-up with PCP Additional Notes: Pt states that she was sick with URI s/s before the ear pain started. Scheduled today.  Copied from CRM [id number]. Topic: Clinical - Red Word Triage >> Dec 13, 2023  8:13 AM Emma Roberts wrote: Red Word that prompted transfer to Nurse Triage: States inner ear pain, last couple days, only on the right side. No other symptoms noted Reason for Disposition  Earache  (Exceptions: brief ear pain of < 60 minutes duration, earache occurring during air travel  Answer Assessment - Initial Assessment Questions 1. LOCATION: "Which ear is involved?"     R inner ear 2. ONSET: "When did the ear start hurting"      About a week 3. SEVERITY: "How bad is the pain?"  (Scale 1-10; mild, moderate or severe)   - MILD (1-3): doesn't interfere with normal activities    - MODERATE (4-7): interferes with normal activities or awakens from sleep    - SEVERE (8-10): excruciating pain, unable to do any normal activities      8-9 4. URI SYMPTOMS: "Do you have a runny nose or cough?"     Denies, states she just got over a URI 5. FEVER: "Do you have a fever?" If Yes, ask: "What is your temperature, how was it measured, and when did it start?"     denies 6. CAUSE: "Have you been swimming recently?", "How often do you use Q-TIPS?", "Have you had any recent air travel or scuba diving?"     denies 7. OTHER SYMPTOMS: "Do you have any other symptoms?" (e.g., headache, stiff neck, dizziness, vomiting, runny nose, decreased hearing)     Dizziness-mild x several days  Protocols used: Earache-A-AH

## 2023-12-21 ENCOUNTER — Ambulatory Visit

## 2023-12-24 ENCOUNTER — Encounter: Payer: Self-pay | Admitting: Internal Medicine

## 2023-12-24 ENCOUNTER — Ambulatory Visit (INDEPENDENT_AMBULATORY_CARE_PROVIDER_SITE_OTHER): Admitting: Internal Medicine

## 2023-12-24 VITALS — BP 110/73 | HR 79 | Ht 61.0 in | Wt 151.8 lb

## 2023-12-24 DIAGNOSIS — F32A Depression, unspecified: Secondary | ICD-10-CM | POA: Diagnosis not present

## 2023-12-24 DIAGNOSIS — H6121 Impacted cerumen, right ear: Secondary | ICD-10-CM

## 2023-12-24 DIAGNOSIS — F419 Anxiety disorder, unspecified: Secondary | ICD-10-CM | POA: Diagnosis not present

## 2023-12-24 MED ORDER — HYDROXYZINE PAMOATE 25 MG PO CAPS: 25.0000 mg | ORAL_CAPSULE | Freq: Three times a day (TID) | ORAL | 2 refills | Status: AC | PRN

## 2023-12-24 NOTE — Progress Notes (Signed)
   Acute Office Visit  Subjective:     Patient ID: Emma Roberts, female    DOB: [birth year], 68 y.o.   MRN: [id number]  Chief Complaint  Patient presents with   Ear Pain    Right ear pain    [name] presents today for an acute visit endorsing right ear discomfort for the last 3-4 days.  Denies recent sinus congestion as well as fever/chills.  She has not appreciated any drainage from the right ear.  Denies impaired hearing.  She has had issues with recurrent right ear discomfort previously.  Her additional concern today is increased stress and anxiety, which she attributes to moving in with her daughter last month.  She would like to discuss treatment options for improved management of anxiety.  Review of Systems  HENT:  Positive for ear pain (Right ear discomfort). Negative for ear discharge.   Psychiatric/Behavioral:  The patient is nervous/anxious.       Objective:    BP 110/73   Pulse 79   Ht 5\' 1"  (1.549 m)   Wt 151 lb 12.8 oz (68.9 kg)   SpO2 94%   BMI 28.68 kg/m   Physical Exam Vitals reviewed.  Constitutional:      General: She is not in acute distress.    Appearance: Normal appearance.  HENT:     Right Ear: Ear canal and external ear normal. There is impacted cerumen.     Left Ear: Tympanic membrane, ear canal and external ear normal. There is no impacted cerumen.  Neurological:     Mental Status: She is alert.       Assessment & Plan:   Problem List Items Addressed This Visit       Impacted cerumen, right ear   Presenting today for an acute visit endorsing a 3-4-day history of right ear discomfort.  Cerumen impaction of the right ear noted on exam.  This was successfully irrigated and symptoms improved.  Will have her return to care for routine follow-up in 3 months.      Anxiety and depression - Primary   Her additional concern today is increase stress and anxiety for the last month.  She is currently prescribed fluoxetine  40 mg daily.  She attributes  her increased stress to moving in with her daughter last month.  Her daughter has 2 young children and this has caused conflict at times.  Treatment options reviewed.  Will add hydroxyzine for as needed anxiety relief.  She will return to care for routine follow-up in 3 months.      Meds ordered this encounter  Medications   hydrOXYzine (VISTARIL) 25 MG capsule    Sig: Take 1 capsule (25 mg total) by mouth every 8 (eight) hours as needed for anxiety.    Dispense:  30 capsule    Refill:  2    Return in about 3 months (around 03/25/2024).  Tobi Fortes, MD

## 2023-12-24 NOTE — Assessment & Plan Note (Signed)
 Her additional concern today is increase stress and anxiety for the last month.  She is currently prescribed fluoxetine  40 mg daily.  She attributes her increased stress to moving in with her daughter last month.  Her daughter has 2 young children and this has caused conflict at times.  Treatment options reviewed.  Will add hydroxyzine for as needed anxiety relief.  She will return to care for routine follow-up in 3 months.

## 2023-12-24 NOTE — Assessment & Plan Note (Signed)
 Presenting today for an acute visit endorsing a 3-4-day history of right ear discomfort.  Cerumen impaction of the right ear noted on exam.  This was successfully irrigated and symptoms improved.  Will have her return to care for routine follow-up in 3 months.

## 2023-12-24 NOTE — Patient Instructions (Signed)
 It was a pleasure to see you today.  Thank you for giving us  the opportunity to be involved in your care.  Below is a brief recap of your visit and next steps.  We will plan to see you again in 3 months.  Summary We will irrigate your right ear today Add hydroxyzine for as needed anxiety relief Follow up in 3 months

## 2024-01-02 ENCOUNTER — Ambulatory Visit (HOSPITAL_COMMUNITY)
Admission: RE | Admit: 2024-01-02 | Discharge: 2024-01-02 | Disposition: A | Discharge status: Home / Self Care | Source: Ambulatory Visit | Attending: Internal Medicine | Admitting: Internal Medicine

## 2024-01-02 DIAGNOSIS — Z1231 Encounter for screening mammogram for malignant neoplasm of breast: Secondary | ICD-10-CM | POA: Insufficient documentation

## 2024-01-04 DIAGNOSIS — H52209 Unspecified astigmatism, unspecified eye: Secondary | ICD-10-CM | POA: Diagnosis not present

## 2024-01-04 DIAGNOSIS — H5203 Hypermetropia, bilateral: Secondary | ICD-10-CM | POA: Diagnosis not present

## 2024-01-04 DIAGNOSIS — H524 Presbyopia: Secondary | ICD-10-CM | POA: Diagnosis not present

## 2024-01-15 ENCOUNTER — Other Ambulatory Visit: Payer: Self-pay | Admitting: Internal Medicine

## 2024-01-21 ENCOUNTER — Telehealth: Payer: Self-pay

## 2024-01-21 NOTE — Telephone Encounter (Signed)
 Copied from CRM [id number]. Topic: General - Other >> Jan 21, 2024 11:06 AM Gustabo D wrote: Calling to request medical records, confirm pcp no one listed but has been seen at the office. [facility] Adult Social worker needs to speak with someone regarding her cognitive status or any other concerns regarding the patient. Call back at [phone] I provided her with the fax number.

## 2024-01-29 NOTE — Telephone Encounter (Signed)
 I spoke with Emma Roberts at adult social services concerning patient's cognitive issues.  I explained to her that I have not seen the patient before, but she has an appointment with me in early September.  She has met with the patient and her current living situation and feels that her cognitive abilities are intact and that she is mainly stressed by current living situation.  Of note, I did see a note for neurocognitive evaluation in December 2024 through [facility].  I relayed findings of this to Ms. Roberts

## 2024-03-25 ENCOUNTER — Ambulatory Visit

## 2024-04-10 ENCOUNTER — Other Ambulatory Visit: Payer: Self-pay | Admitting: Internal Medicine

## 2024-04-10 DIAGNOSIS — F339 Major depressive disorder, recurrent, unspecified: Secondary | ICD-10-CM

## 2024-04-16 ENCOUNTER — Other Ambulatory Visit: Payer: Self-pay | Admitting: Internal Medicine

## 2024-05-21 ENCOUNTER — Other Ambulatory Visit: Payer: Self-pay

## 2024-05-21 DIAGNOSIS — F339 Major depressive disorder, recurrent, unspecified: Secondary | ICD-10-CM

## 2024-07-08 ENCOUNTER — Other Ambulatory Visit: Payer: Self-pay
